# Patient Record
Sex: Female | Born: 1955 | Race: White | Hispanic: No | Marital: Married | Smoking: Never smoker
Health system: Southern US, Community
[De-identification: ages and names within clinical notes are randomized; demographics above are authoritative.]

## PROBLEM LIST (undated history)

## (undated) DIAGNOSIS — F319 Bipolar disorder, unspecified: Secondary | ICD-10-CM

## (undated) DIAGNOSIS — F329 Major depressive disorder, single episode, unspecified: Secondary | ICD-10-CM

## (undated) DIAGNOSIS — F32A Depression, unspecified: Secondary | ICD-10-CM

## (undated) DIAGNOSIS — K5792 Diverticulitis of intestine, part unspecified, without perforation or abscess without bleeding: Secondary | ICD-10-CM

## (undated) HISTORY — DX: Diverticulitis of intestine, part unspecified, without perforation or abscess without bleeding: K57.92

## (undated) HISTORY — DX: Major depressive disorder, single episode, unspecified: F32.9

## (undated) HISTORY — DX: Bipolar disorder, unspecified: F31.9

## (undated) HISTORY — PX: SINOSCOPY: SHX187

## (undated) HISTORY — DX: Depression, unspecified: F32.A

---

## 1988-08-31 HISTORY — PX: APPENDECTOMY: SHX54

## 1989-08-31 HISTORY — PX: SINUS ENDO W/FUSION: SHX777

## 2007-09-01 HISTORY — PX: CHOLECYSTECTOMY: SHX55

## 2014-09-19 ENCOUNTER — Ambulatory Visit (INDEPENDENT_AMBULATORY_CARE_PROVIDER_SITE_OTHER): Payer: BLUE CROSS/BLUE SHIELD | Admitting: Neurology

## 2014-09-19 ENCOUNTER — Encounter: Payer: Self-pay | Admitting: Neurology

## 2014-09-19 VITALS — BP 112/75 | HR 89 | Ht 63.0 in | Wt 166.0 lb

## 2014-09-19 DIAGNOSIS — R413 Other amnesia: Secondary | ICD-10-CM

## 2014-09-19 NOTE — Progress Notes (Signed)
Reason for visit: Memory disturbance   Bridget Long is a 59 y.o. female  History of present illness:  Bridget Long is a 59 year old right-handed white female with a history of some issues with memory that she indicates has been significant since March 2015. The patient reports difficulty with focusing, and staying on task. She is easily distracted, and cannot complete tasks. The patient goes on to say that she has had problems with procrastinating in getting things done, she has not paid her taxes in over 4 years. She does report some troubles with memory, she cannot remember her password for the computer, she has difficulty remembering her own address and her phone number. The patient may have to go and look at her driver's license to remember her living address. She denies any balance issues or problems with numbness or weakness on the arms, legs, or face. She denies any problems controlling the bowels or the bladder. She indicates that her sister is hyperactive, and may have ADD. The patient has had issues with depression and anxiety. She has been seeing Dr. Evelene Croon, and she recently was placed on Adderall with some good benefit. She is also on an antidepressant. Her mother died about 2 weeks ago, and she is getting over this loss. She is currently taking a leave of absence from work, as she has not been functioning well at work. Her productivity level has dropped off significantly. She is sent to this office for further evaluation.  Past Medical History  Diagnosis Date  . Depression   . Diverticulitis     Past Surgical History  Procedure Laterality Date  . Appendectomy  1990  . Cholecystectomy  2009  . Sinus endo w/fusion  1991    Family History  Problem Relation Age of Onset  . Pulmonary embolism Father   . Alzheimer's disease Father   . Healthy Sister     Social history:  reports that she has never smoked. She has never used smokeless tobacco. She reports that she drinks  alcohol. She reports that she does not use illicit drugs.  Medications:  No current outpatient prescriptions on file prior to visit.   No current facility-administered medications on file prior to visit.      Allergies  Allergen Reactions  . Sulfa Antibiotics Rash    ROS:  Out of a complete 14 system review of symptoms, the patient complains only of the following symptoms, and all other reviewed systems are negative.  Memory loss Depression, too much sleep, decreased energy, disinterest in activities, anxiety  Blood pressure 112/75, pulse 89, height  (1.6 m), weight 166 lb (75.297 kg).  Physical Exam  General: The patient is alert and cooperative at the time of the examination.  Eyes: Pupils are equal, round, and reactive to light. Discs are flat bilaterally.  Neck: The neck is supple, no carotid bruits are noted.  Respiratory: The respiratory examination is clear.  Cardiovascular: The cardiovascular examination reveals a regular rate and rhythm, no obvious murmurs or rubs are noted.  Skin: Extremities are without significant edema.  Neurologic Exam  Mental status: The patient is alert and oriented x 3 at the time of the examination. The patient has apparent normal recent and remote memory, with an apparently normal attention span and concentration ability. MOCA test score is 27/30. The patient named 19 animals on the animal fluency test.  Cranial nerves: Facial symmetry is present. There is good sensation of the face to pinprick and soft touch bilaterally.  The strength of the facial muscles and the muscles to head turning and shoulder shrug are normal bilaterally. Speech is well enunciated, no aphasia or dysarthria is noted. Extraocular movements are full. Visual fields are full. The tongue is midline, and the patient has symmetric elevation of the soft palate. No obvious hearing deficits are noted.  Motor: The motor testing reveals 5 over 5 strength of all 4  extremities. Good symmetric motor tone is noted throughout.  Sensory: Sensory testing is intact to pinprick, soft touch, vibration sensation, and position sense on all 4 extremities. No evidence of extinction is noted.  Coordination: Cerebellar testing reveals good finger-nose-finger and heel-to-shin bilaterally.  Gait and station: Gait is normal. Tandem gait is normal. Romberg is negative. No drift is seen.  Reflexes: Deep tendon reflexes are symmetric and normal bilaterally. Toes are downgoing bilaterally.   Assessment/Plan:  1. Reported memory disturbance  2. 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 Depression, anxiety  The patient likely has a problem with a pre-existing attention deficit disorder associated with underlying depression and anxiety. The patient does have a history of dementia in the family, her father had Alzheimer's disease. The patient will be sent for blood work today, and she will have MRI evaluation of the brain. She will follow-up through this office if needed, we can see her back if she feels that her cognitive levels are deteriorating. Formal neuropsychological testing in the future may be indicated. She indicates that she has gained benefit with the Adderall, and she is functioning better cognitively.  Marlan Palau. Keith Willis MD 09/19/2014 7:01 PM  [facility] [address] [place], [place] [id number]  Phone [phone] Fax [phone]

## 2014-09-19 NOTE — Patient Instructions (Signed)

## 2014-09-20 LAB — SPECIMEN STATUS REPORT

## 2014-09-21 LAB — COPPER, SERUM: Copper: 132 ug/dL (ref 72–166)

## 2014-09-21 LAB — TSH: TSH: 2.37 u[IU]/mL (ref 0.450–4.500)

## 2014-09-21 LAB — SEDIMENTATION RATE: Sed Rate: 3 mm/hr (ref 0–40)

## 2014-09-21 LAB — HIV ANTIBODY (ROUTINE TESTING W REFLEX): HIV 1/HIV 2 AB: NONREACTIVE

## 2014-09-21 LAB — VITAMIN B12: Vitamin B-12: 1234 pg/mL — ABNORMAL HIGH (ref 211–946)

## 2014-09-21 LAB — RPR QUALITATIVE: RPR: NONREACTIVE

## 2014-09-21 LAB — SPECIMEN STATUS REPORT

## 2014-09-26 ENCOUNTER — Ambulatory Visit
Admission: RE | Admit: 2014-09-26 | Discharge: 2014-09-26 | Disposition: A | Discharge status: Home / Self Care | Payer: BLUE CROSS/BLUE SHIELD | Source: Ambulatory Visit | Attending: Neurology | Admitting: Neurology

## 2014-09-26 DIAGNOSIS — R413 Other amnesia: Secondary | ICD-10-CM

## 2014-09-27 ENCOUNTER — Telehealth: Payer: Self-pay | Admitting: Neurology

## 2014-09-27 DIAGNOSIS — R9089 Other abnormal findings on diagnostic imaging of central nervous system: Secondary | ICD-10-CM

## 2014-09-27 NOTE — Telephone Encounter (Signed)
I called the patient. The MRI the brain shows a number of small white matter changes in both hemispheres. These are nonspecific, but given her complaints of a progressive cognitive change, I have recommended considering lumbar puncture to exclude multiple sclerosis. The patient will "think about it" and call me back if she wishes to do the procedure.   MRI brain 09/27/2014:  IMPRESSION:  Abnormal MRI brain (without) demonstrating: 1. Multiple periventricular, subcortical and juxtacortical foci of T2 hyperintensities. These findings are non-specific and considerations include autoimmune, inflammatory, post-infectious, microvascular ischemic or migraine associated etiologies. 2. No acute findings.

## 2014-09-28 NOTE — Addendum Note (Signed)
Addended by: Stephanie AcreWILLIS, Anyeli Hockenbury on: 09/28/2014 05:41 PM   Modules accepted: Orders

## 2014-09-28 NOTE — Telephone Encounter (Signed)
I called the patient, left a message. We will set up the lumbar puncture.

## 2014-09-28 NOTE — Telephone Encounter (Signed)
Patient stated she would like to proceed with Lumbar Puncture to exclude multiple sclerosis.  Please call and advise.

## 2014-10-08 ENCOUNTER — Telehealth: Payer: Self-pay | Admitting: Neurology

## 2014-10-08 NOTE — Telephone Encounter (Signed)
I will get the lumbar puncture on schedule.

## 2014-10-08 NOTE — Telephone Encounter (Signed)
Patient is calling to get scheduled for a lumbar spinal puncture. I do not see a referral on file. Please call her.

## 2014-10-08 NOTE — Telephone Encounter (Signed)
Patient would like to proceed with LP, don't see any referral/order for the procedure.

## 2014-10-09 NOTE — Telephone Encounter (Signed)
Spoke to patient and she is scheduled for 10-1514 at 12 noon for her LP.

## 2014-10-15 ENCOUNTER — Encounter: Payer: Self-pay | Admitting: Neurology

## 2014-10-16 ENCOUNTER — Telehealth: Payer: Self-pay | Admitting: Neurology

## 2014-10-16 NOTE — Telephone Encounter (Signed)
Noted  

## 2014-10-16 NOTE — Telephone Encounter (Signed)
Left message for patient to call back to reschedule lumbar puncture.  I relayed that Friday at 0930 would work for the doctor but did not schedule her until I hear back.

## 2014-10-16 NOTE — Telephone Encounter (Signed)
Patient was given Friday 9:30 apt for lumbar puncture. GB

## 2014-10-16 NOTE — Telephone Encounter (Signed)
Patient calling Bridget Long back to say that Friday at 9:30 works for her if it is still available, if not she will take the next available.  She was at another appointment and could not pick up earlier. Please call at [phone].

## 2014-10-19 ENCOUNTER — Ambulatory Visit (INDEPENDENT_AMBULATORY_CARE_PROVIDER_SITE_OTHER): Payer: BLUE CROSS/BLUE SHIELD | Admitting: Neurology

## 2014-10-19 ENCOUNTER — Other Ambulatory Visit: Payer: Self-pay | Admitting: Neurology

## 2014-10-19 ENCOUNTER — Encounter: Payer: Self-pay | Admitting: Neurology

## 2014-10-19 VITALS — BP 121/75 | HR 97

## 2014-10-19 DIAGNOSIS — R9089 Other abnormal findings on diagnostic imaging of central nervous system: Secondary | ICD-10-CM | POA: Insufficient documentation

## 2014-10-19 DIAGNOSIS — R413 Other amnesia: Secondary | ICD-10-CM

## 2014-10-19 DIAGNOSIS — R930 Abnormal findings on diagnostic imaging of skull and head, not elsewhere classified: Secondary | ICD-10-CM

## 2014-10-19 NOTE — Addendum Note (Signed)
Addended by: Margo AyeLOGAN, Tanairi Cypert L on: 10/19/2014 03:02 PM   Modules accepted: Orders

## 2014-10-19 NOTE — Addendum Note (Signed)
Addended by: Stephanie AcreWILLIS, Kendell Gammon on: 10/19/2014 11:45 AM   Modules accepted: Orders

## 2014-10-19 NOTE — Addendum Note (Signed)
Addended by: Margo AyeLOGAN, Josemanuel Eakins L on: 10/19/2014 10:38 AM   Modules accepted: Orders

## 2014-10-19 NOTE — Procedures (Signed)
    Lumbar puncture procedure note  History:  Bridget Long is a 59 year old patient with a history of progressive memory issues, MRI the brain has shown white matter changes. The patient being evaluated for possible demyelinating disease.  The patient was placed in the fetal position on the right side, and the low back was cleaned with Betadine solution. Approximately 2 cc of 1% Xylocaine was used as a local anesthetic. A 20-gauge spinal needle was inserted into the L3-4 interspace, and approximately 18 cc of clear colorless spinal fluid was removed for testing. Opening pressure was 190 mm of water.  Tube #1 was sent for VDRL, cryptococcal antigen, angiotensin-converting enzyme level.  Tube #2 was sent for oligoclonal banding.  Tube #3 was sent for cells, differential, glucose, and protein.  Tube #4 was sent for Lyme antibody panel.  The patient tolerated the procedure well. No complications of the above procedure were noted.   Blood work was sent for rheumatoid factor, ANA, ANCA, Lyme antibody panel, angiotensin-converting enzyme level.

## 2014-10-20 LAB — CELL COUNT, CSF
Nuc cell # CSF: 2 cells/uL (ref 0–5)
RBC COUNT CSF: 0 /uL

## 2014-10-22 LAB — IGG CSF INDEX
ALBUMIN CSF-MCNC: 12 mg/dL (ref 11–48)
Albumin: 4.3 g/dL (ref 3.5–5.5)
IgG (Immunoglobin G), Serum: 551 mg/dL — ABNORMAL LOW (ref 700–1600)
IgG Index, CSF: 0.5 (ref 0.0–0.7)
IgG, CSF: 0.7 mg/dL (ref 0.0–8.6)
IgG/Albumin Ratio, CSF: 0.06 (ref 0.00–0.25)

## 2014-10-22 LAB — OLIGOCLONAL BANDING

## 2014-10-23 LAB — OLIGOCLONAL BANDING

## 2014-10-23 LAB — SPECIMEN STATUS REPORT

## 2014-10-25 ENCOUNTER — Telehealth: Payer: Self-pay | Admitting: *Deleted

## 2014-10-25 NOTE — Telephone Encounter (Signed)
Patient calling wanting the results of her LP. She has 2 other doctor appt today and wants to know if there is anything she needs to pass along to her other physicians. Please advise.

## 2014-10-25 NOTE — Telephone Encounter (Signed)
I called patient. The blood work and spinal fluid results are unremarkable, only the angiotensin-converting enzyme level and the spinal fluid is pending. There are 0 oligoclonal bands, no evidence of demyelinating disease such as multiple sclerosis.

## 2014-10-26 LAB — LYME, WESTERN BLOT, CSF
LYME IGM WB INTERP.: NEGATIVE
Lyme IgG WB Interp.: NEGATIVE
P18 AB.: ABSENT
P23 AB.: ABSENT
P23 Ab. IgM: ABSENT
P28 Ab.: ABSENT
P30 AB.: ABSENT
P39 Ab. IgM: ABSENT
P39 Ab.: ABSENT
P41 Ab. IgM: ABSENT
P41 Ab.: ABSENT
P45 Ab.: ABSENT
P58 Ab.: ABSENT
P66 Ab.: ABSENT
P93 Ab.: ABSENT

## 2014-10-26 LAB — PAN-ANCA
Atypical pANCA: <1:20 {titer}
C-ANCA: <1:20 {titer}
Myeloperoxidase Ab: <9 U/mL (ref 0.0–9.0)

## 2014-10-26 LAB — CRYPTOCOCCUS ANTIGEN, CSF: Cryptococcus Antigen, CSF: NEGATIVE

## 2014-10-26 LAB — RHEUMATOID FACTOR: RHEUMATOID FACTOR: 8.7 [IU]/mL (ref 0.0–13.9)

## 2014-10-26 LAB — B. BURGDORFI ANTIBODIES

## 2014-10-26 LAB — ANGIOTENSIN CONVERTING ENZYME, CSF: Angio Convert Enzyme, CSF: 1.3 U/L (ref 0.0–2.5)

## 2014-10-26 LAB — GLUCOSE, CSF: Glucose, CSF: 68 mg/dL (ref 40–70)

## 2014-10-26 LAB — ANGIOTENSIN CONVERTING ENZYME: ANGIO CONVERT ENZYME: 69 U/L (ref 14–82)

## 2014-10-26 LAB — PROTEIN, CSF: Protein, CSF: 18.9 mg/dL (ref 0.0–44.0)

## 2014-10-26 LAB — ANA W/REFLEX: Anti Nuclear Antibody(ANA): NEGATIVE

## 2014-10-26 LAB — VDRL, CSF: SYPHILIS VDRL QUANT CSF: NONREACTIVE

## 2015-01-31 ENCOUNTER — Ambulatory Visit: Payer: BLUE CROSS/BLUE SHIELD | Attending: Psychology | Admitting: Psychology

## 2015-01-31 ENCOUNTER — Encounter: Payer: Self-pay | Admitting: Psychology

## 2015-01-31 DIAGNOSIS — R419 Unspecified symptoms and signs involving cognitive functions and awareness: Secondary | ICD-10-CM

## 2015-01-31 NOTE — Progress Notes (Addendum)
[facility]  [address]   Telephone [phone] [address] Fax [phone] [place], [place] [address]  Initial Contact Note  Name:  Bridget Long Rhome Date of Birth; [birth year] MRN:  [id number] Date:  01/31/2015  Bridget Long Sailer is an 59 y.o. female with a history of Major Depression and recent abnormal brain MRI scan who was referred for neuropsychological evaluation by Milagros Evenerupinder Kaur, MD due to problems with attention, concentration and memory.   A total of 5 hours was spent today reviewing medical records, interviewing (CPT [id number]) Bridget Long Richoux and administering and scoring neurocognitive tests (CPT [id number] & [phone]).  Preliminary Diagnostic Impression: Cognitive Complaints [R41.9]  There were no concerns expressed or behaviors displayed by Bridget Long Wiegman that would require immediate attention.   A full report will follow once the planned testing has been completed. Her next appointment is scheduled for 02/06/15.   Gladstone PihMichael F. Babara Buffalo, Ph.D Licensed Psychologist 01/31/2015

## 2015-02-06 ENCOUNTER — Ambulatory Visit (INDEPENDENT_AMBULATORY_CARE_PROVIDER_SITE_OTHER): Payer: BLUE CROSS/BLUE SHIELD | Admitting: Psychology

## 2015-02-06 DIAGNOSIS — R419 Unspecified symptoms and signs involving cognitive functions and awareness: Secondary | ICD-10-CM

## 2015-02-06 NOTE — Progress Notes (Addendum)
[facility]  [address]   Telephone [phone] [address] Fax [phone] [place], [place] [address]   NEUROPSYCHOLOGICAL EVALUATION  *CONFIDENTIAL* This report should not be released without the consent of the client  Name:   Bridget Long Date of Birth:  [birth year] [facility] MR#:  [id number] Dates of Evaluation: 01/31/15 & 02/06/15  Reason for Referral Bridget Long is a 59 year old right-handed woman who was referred for neuropsychological evaluation by her psychiatrist, Milagros Evener MD. Dr. Evelene Croon has been treating her for Major Depressive Disorder. Bridget Long demonstrated an abrupt onset of problems with sustained attention, concentration and memory in 2012 that have since worsened. Use of amphetamine-dextroamphetamine since November 2015 has been a bit helpful. She was evaluated by Dr. Anne Hahn of [facility] on 09/19/14. He initially concluded that she likely has "a pre-existing attention deficit disorder associated with underlying depression and anxiety". An MRI the brain on 09/26/14 showed multiple, non-specific small white matter changes in both hemispheres. A lumbar puncture on 10/19/14, which was ordered to exclude a demyelinating disease, was unremarkable. Dr. Evelene Croon prescribed Viibryd in March 2015 and most recently Lithium in May 2016.   Sources of Information Electronic medical records from the [facility] were reviewed. Bridget Long was interviewed.    Chief Complaints Bridget Long reported that for the first time in her life she began having difficulties with focus, mental speed, persisting to task and memory in 2012 coincident to moving to this area from [place] to start a new job. 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 She recollected often getting bogged down in details and finding ways to avoid doing assigned tasks.  She reported that on average more than once per day she forgot where she had put something, had problems finding words while speaking and lost track of what she had just said. She reported a sharp decline in her organizational skills. On occasion she has had difficulty remembering her own address and her phone number. She noted that she also experienced similar cognitive difficulties outside of work. She stopped completing most household chores because "I didn't want to". She reported that she was in "a deep funk" at that time.  She reported that in spring 2015 her cognitive difficulties worsened. 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 She came to realize how much she had been depending on this co-worker to help her perform her job. She took a medical leave of absence from work in November 2015 after her productivity level had significantly declined. She reported that use of amphetamine-dextroamphetamine beginning in November 2015 improved her focus and motivation though not close to her usual baseline. She returned to work in April 2015 but took another leave from work after six weeks to deal with the estate of her mother, who had died in 2013/09/04.   She reported having felt depressed most of the time over the past two years. She did not report a history of mania though did describe herself as fidgety, easily irritated, feeling overly active or compelled to do things, talking too much and finishing the sentences of others. She reported two instances of engaging in excessive spending within the past year.  She stated that her cognitive functioning and mood have notably improved just within the past week  or so subsequent to starting Lithium in late May 2016.    She denied problems with balance, gait, eye-hand coordination, limb strength, vision, hearing, sleep, daytime alertness, speech, taste or  swallowing. She did not report being in pain.  Background Bridget Long lives with her husband of ten years. She has two adult sons from her first marriage, which ended in divorce after fifteen years.   She reported no history of developmental delays, unusual childhood illnesses or injuries, inattentiveness, learning difficulties or behavioral problems.   After graduating high school with good grades, she matriculated at [employer]. Due to financial limitations, she was forced to drop out in her second year. She then joined the [employer]. She spent almost fourteen years in the [employer], eventually attaining the rank of captain. She was stationed in the [place] and worked in Public affairs consultant. While in the [employer], she earned a Heritage manager in Actuary.     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 In recent years she has worked for [employer] as a Emergency planning/management officer. Since 2014 she has been employed as a Therapist, nutritional" for a bank with primary responsibility of seeking ways to improve the efficiency of banking systems.  Her past medical history was notable only for diverticulitis. She reported no history of head injury, loss of consciousness, stroke-like symptoms, seizures or exposure to toxic chemicals.  She reported rare use of alcohol over the past many years. She acknowledged a period of excessive drinking while in college. She denied use of illicit drugs or tobacco products.   With regards to family medical history, she reported that her father had depression followed by signs of memory loss in his sixties that was eventually diagnosed as Alzheimer's disease. She stated that her mother was diagnosed with Bipolar Disorder. She has suspected that her sister has an Attention Deficit Disorder.  She reported no history of emotional difficulties until 2012. She reported  that her primary care physician prescribed her Zoloft in 2012 for what was termed seasonal depression. She has been seeing a Research scientist (physical sciences) off and on since 2013. Her first psychiatric contact was with Dr. Evelene Croon in 2015. She reported no history of suicidal behavior, panic anxiety, obsessive-compulsive behavior or psychotic symptoms.   Her current medications include amphetamine-dextroamphetamine, fexofenadine, lamotrigine, Lithium and Viibryd.  She reported no past of current legal issues.  Evaluation Procedures Animal Naming Test  Beck Anxiety Inventory Beck Depression Inventory-II Boston Naming Test Conners Continuous Performance Test-II Controlled Oral Word Association Test Everyday Memory Questionnaire Rey Complex Figure: Copy Trail Making A & B Wechsler Adult Intelligence Scale-IV:  Clinical cytogeneticist, Coding, Digit Span, Matrix Reasoning & Similarities Wechsler Memory Scale-IV Wide Range Achievement Test-4: Word Reading [employer] Test  Test Results Test Validity & Interpretive Considerations It was concluded that the test results represented a valid measure of her cognitive functioning. She did not report or display problems with vision (she wore her eyeglasses), hearing or motor skills. She did not display signs of inattention, psychomotor slowing or emotional distress. She was able to comprehend task instructions without difficulty. There were no signs of careless or perseverative responding. She appeared to expend maximal effort.   She stated that she did not take amphetamine-dextroamphetamine on the days of testing.    Her intellectual potential was estimated to fall at least within the Average range based  on demographic factors coupled with a measure of word reading (Wide Range Achievement Test-4) that represents an over-learned verbal skill relatively resistant to the effects of neurological disorder or injury.   Her test scores were corrected to  reflect norms for her age and whenever possible, her gender and educational level (i.e., 16 years). A listing of test results can be found at the end of this report.  Speed of Processing & Attention Her performance on a test that required sustained visual attention to detect targets flashed on a screen (Continuous Performance Test II) did not indicate attentional problems. She was able to detect a typical number of targets. Her reaction times were faster than average and highly consistent. She did make a high number of responses to non-targets, which indicated difficulty inhibiting responses suggestive of an impulsive style.  Her speed to complete brief visual attentional tests that required transcription of symbols to match digits using a key (Wechsler Adult Intelligence Scale-IV (WAIS-IV) Coding) or drawing lines to connect randomly arrayed numbers in sequence (Trails A) were within the Average range.   Her attentional capacity to encode, hold and manipulate information in temporary memory was within normal expectations. Her scores varied from the Low Average to the Superior range on tests that required mental rearranging of digits in reverse or ascending order (WAIS-IV Digit Span), immediate recognition of symbols in left to right order (Wechsler Memory Scale-IV (WMS-IV) Symbol Span) or immediate recall of spatial locations within a grid (WMS-IV Spatial Addition).  Learning & Memory On the WMS-IV, a composite measure of her ability to learn and retain orally-presented information (Auditory Memory Index) fell solidly within the Average range. She demonstrated a relative strength on a test of learning word pairs to repetition. Her ability to learn and retain visual information (Visual Memory Index) was relatively less well developed at the lower boundary of the Average range. More specifically, her visual memory was weakest at the stage of immediate recall as her immediate visual memory subtest scores  varied from the 9th to 25th percentiles. A composite measure of her delayed memory (Delayed Memory Index), assessed after an approximate thirty minute interval, fell within the Average range compared to same aged peers. Not only did she demonstrate normal retention of both auditory and visual information, but her delayed recall, especially for visual information, actually exceeded expectations given her level of immediate memory. This pattern suggested that she benefitted from the extra time of the delay interval to more fully consolidate information into memory.   Executive Personnel officer functions comprise higher level attentional, organizational and reasoning processes that enable a person to successfully initiate, monitor, shift, plan and execute complex behavior.   Her speed to maintain an ascending and alternating sequence of numbers and letters (Trails B), which required mental tracking and set shifting, fell within the Low Average range. She maintained the sequence without error.    While she demonstrated difficulty with response inhibition on a target detection task, there were no other signs of impulsive tendencies.   Verbal fluency tests require cognitive flexibility as manifested by efficiently retrieving information from long-term semantic memory, tracking prior responses and blocking intrusions from other categories. Her ability to name members of a category (Animal Naming Test) fell within the Low Average range while her ability to generate words to designated letters (Controlled Oral Word Association Test) fell within the Average range.  Conceptual reasoning and problem-solving were normal. Her performances on tests of abstract verbal reasoning (WAIS-IV Similarities) and nonverbal reasoning (WAIS-IV Matrix  Reasoning) fell within the Average range. Her performance on a test of nonverbal problem-solving that required using ongoing feedback to infer logical ways to sort geometric designs  [employer]([place] Card Sorting Test) was within normal expectations. She quickly inferred the initial sorting principle, deduced all possible sorting ideas, for the most part maintained conceptual strategy in working memory and adaptively shifted to a new strategy given feedback that the previously correct strategy had become incorrect.   Language From a qualitative perspective, no problems were evident for expressive or receptive communication. Her scores on tests of phonemic fluency (Controlled Oral Word Association Test) and naming to confrontation [facility](Boston Naming Test) fell within the Average range. Her ability to read words (Wide Range Achievement Test-4: Word Reading) fell within the High Average range.   Visual-Spatial Perception & Organization There were no signs of spatial inattention or problems with visual recognition. Her performance on a task that assessed her ability to analyze and organize within the visuospatial domain by assembling two-dimensional block designs from models (WAIS-IV Block Design) fell within the Average range. Her drawing of a spatially complex geometric figure (Rey Complex Figure) was within normal limits.  Emotional Status She reported relatively high levels of emotional distress on standardized symptom inventories. On the Beck Depression Inventory-II, her score of 29 fell just within the severely depressed range. Her most prominent symptoms (i.e., 3 on a 0 - 3 scale) were loss of pleasure, trouble making decisions and loss of interest in sex. She did not report having had suicidal thoughts within the past week. On the [facility] Anxiety Inventory, her score of 17 fell within the moderate range. Her most troublesome symptoms were feeling "terrified or afraid" and "hot/cold sweats".    Summary & Conclusions Jolly MangoMachelle Long is a 59 year old woman being treated for Major Depression who demonstrated a sudden onset of problems with sustained attention, concentration and memory in 2012  that have since worsened.   Overall, her neuropsychological profile was predominantly within normal expectations. There were no indications of an attentional disorder or decrement for sustained concentration. Assessment of memory indicated that measures of auditory and visual memory were within the Average range though she demonstrated a relative weakness for immediate visual memory. Not only did she demonstrate normal retention of both auditory and visual information, but she benefitted from the extra time of the delay interval to more fully consolidate information, especially visual, into memory. Some of her performances on tests of executive functioning were mildly below expectations as she had difficulty inhibiting responses to non-targets on a vigilance task, performed variably on tests of working memory span and scored within the Low Average range on measures of category fluency and set shifting efficiency. There were no indications of problems for language or visual-spatial organizational skills.  With regards to her emotional status, she reported feeling more positive and less irritable within the past two weeks, which was shortly after she began taking Lithium in late May 2016. She still reported clinically significant symptoms of depression and anxiety on standardized symptom inventories. She reported that over the past two years she has felt depressed most of the time. She also described herself as fidgety, easily irritated, very talkative at times and overly active.  In conclusion, her cognitive difficulties are likely secondary to a mood disorder. It is possible that she has Bipolar disorder in light of her report of irritability, physical restlessness and distractibility; an absence of attentional or executive difficulties as a child or young adult; a family history of Bipolar Disorder  and recent improvement in mood and cognitive functioning subsequent to use of Lithium.  The results from this  evaluation were discussed with Bridget Long on 02/06/15. We had a lengthy discussion about strategies to minimize her distractibility and improve her everyday organization. The benefits of adequate sleep and regular exercise for brain health and mood were discussed.   I have appreciated the opportunity to evaluate Bridget Long. Please feel free to contact me with any comments or questions.    ______________________ Gladstone Pih, Ph.D Licensed Psychologist   ADDENDUM-NEUROPSYCHOLOGICAL TEST RESULTS  Animal Naming Test Score= 18 18th  (adjusted for age, gender and educational level)    Boston Naming Test Score= 58/60 73rd (adjusted for age, gender and educational level)       Conners' Continuous Performance Test II (selected measures) Measure Guideline  Omissions Within average range  Commissions Mildly atypical  Hit Reaction Time A little fast  Perseverations Within average range  Hit Reaction Time Block Change Within average range  Hit Standard Error Block Change Within average range    Controlled Oral Word Association Test Score=  39 words/5 repetitions 31st (adjusted for age, gender and educational level)    Rey Complex Figure: copy       Score= 34.5/36  Normal       Trails A Score= 23s 0e 70th   (adjusted for age, gender and educational level)  Trails B Score= 76s 0e 21st    (adjusted for age, gender and educational level)    Wechsler Adult Intelligence Scale-IV  Subtest Scaled Score Percentile  Block Design 11 63rd    Similarities 12 75th   Digit Span  Forward               Backward               Sequencing 13 11 14 11  84th        63rd    91st    63rd   Matrix Reasoning 11 63rd   Coding   11 63rd     Wechsler Memory Scale-IV   Index Index Score Percentile  Immediate Memory   89 23rd   Auditory Memory 101 53rd    Visual Memory   90 25th    Delayed Memory 102 55th    Visual Working Memory    68 27th      [employer]  Total  errors= 19 42nd (adjusted for age and education)  Perseverative errors= 12 34th   Categories= 6 >16th   Trials to first category= 11 >16th   Failure to maintain set 2 >16th   Learning to learn= 0.39 >16th     Wide Range Achievement Test-4 Subtest  Raw score Standard score Percentile  Word Reading 67/70 116 86th

## 2015-02-08 ENCOUNTER — Telehealth: Payer: Self-pay | Admitting: Neurology

## 2015-02-08 NOTE — Telephone Encounter (Signed)
I called patient. The formal neuropsychological evaluation testing did not suggest a true dementing illness. This suggested that overlying depression is the major etiology of the cognitive slowing.    Neuropsychological testing results 02/08/2015:  Summary & Conclusions Bridget Long is a 59 year old woman being treated for Major Depression who demonstrated a sudden onset of problems with sustained attention, concentration and memory in 2012 that have since worsened.   Overall, her neuropsychological profile was predominantly within normal expectations. There were no indications of an attentional disorder or decrement for sustained concentration. Assessment of memory indicated that measures of auditory and visual memory were within the Average range though she demonstrated a relative weakness for immediate visual memory. Not only did she demonstrate normal retention of both auditory and visual information, but she benefitted from the extra time of the delay interval to more fully consolidate information, especially visual, into memory. Some of her performances on tests of executive functioning were mildly below expectations as she had difficulty inhibiting responses to non-targets on a vigilance task, performed variably on tests of working memory span and scored within the Low Average range on measures of category fluency and set shifting efficiency. There were no indications of problems for language or visual-spatial organizational skills.  With regards to her emotional status, she reported feeling more positive and less irritable within the past two weeks, which was shortly after she began taking Lithium in late May 2016. She still reported clinically significant symptoms of depression and anxiety on standardized symptom inventories. She reported that over the past two years she has felt depressed most of the time. She also described herself as fidgety, easily irritated, very talkative at times and  overly active.  In conclusion, her cognitive difficulties are likely secondary to a mood disorder. It is possible that she has Bipolar disorder in light of her report of irritability, physical restlessness and distractibility; an absence of attentional or executive difficulties as a child or young adult; a family history of Bipolar Disorder and recent improvement in mood and cognitive functioning subsequent to use of Lithium.

## 2015-02-12 ENCOUNTER — Telehealth: Payer: Self-pay | Admitting: Neurology

## 2015-02-12 NOTE — Telephone Encounter (Signed)
The patient not take has 2 different problems. The patient has abnormal MRI findings on the brain, and some cognitive issues. Both of these will be followed over time. The neuropsychological evaluation suggested there may be a bipolar disorder at work. I would recommend keeping the appointment, but if the patient wishes cancel, she may do so.

## 2015-02-12 NOTE — Telephone Encounter (Signed)
Patient called and requested to talk to Dr. Anne Hahn regarding her 6/23 appt. She would like to know if she still needs to come to this appt. Please call and advise.

## 2015-02-21 ENCOUNTER — Encounter: Payer: Self-pay | Admitting: Nurse Practitioner

## 2015-02-21 ENCOUNTER — Ambulatory Visit (INDEPENDENT_AMBULATORY_CARE_PROVIDER_SITE_OTHER): Payer: BLUE CROSS/BLUE SHIELD | Admitting: Nurse Practitioner

## 2015-02-21 VITALS — BP 101/70 | HR 90 | Ht 63.0 in | Wt 161.0 lb

## 2015-02-21 DIAGNOSIS — R413 Other amnesia: Secondary | ICD-10-CM | POA: Diagnosis not present

## 2015-02-21 DIAGNOSIS — R9089 Other abnormal findings on diagnostic imaging of central nervous system: Secondary | ICD-10-CM

## 2015-02-21 DIAGNOSIS — R930 Abnormal findings on diagnostic imaging of skull and head, not elsewhere classified: Secondary | ICD-10-CM

## 2015-02-21 NOTE — Progress Notes (Signed)
[facility]  PATIENT: Bridget Long DOB: April 18, 1956   REASON FOR VISIT: Follow-up for memory disturbance, abnormal MRI of the brain HISTORY FROM: Patient    HISTORY OF PRESENT ILLNESS:Bridget Long is a 59 year old right-handed white female with a history of some issues with memory that she indicates has been significant since March 2015. The patient reports difficulty with focusing, and staying on task. She is easily distracted, and cannot complete tasks. The patient goes on to say that she has had problems with procrastinating in getting things done, she has not paid her taxes in over 4 years. She does report some troubles with memory, she cannot remember her password for the computer, she has difficulty remembering her own address and her phone number. The patient may have to go and look at her driver's license to remember her living address. She denies any balance issues or problems with numbness or weakness on the arms, legs, or face. She denies any problems controlling the bowels or the bladder. She indicates that her sister is hyperactive, and may have ADD. The patient has had issues with depression and anxiety. She has been seeing Dr. Evelene Croon, and she recently was placed on Adderall with some good benefit. She is also on an antidepressant. Her mother died about 2 weeks ago, and she is getting over this loss. She is currently taking a leave of absence from work, as she has not been functioning well at work. Her productivity level has dropped off significantly. She is sent to this office for further evaluation. UPDATE 02/21/15 Bridget Long  59 year old female returns for follow-up. She was last seen in this office in initially evaluated for memory disturbance by Dr. Anne Hahn 09/19/2014. MRI of the brain was abnormal showing nonspecific white matter changes in both hemispheres. She was set up for an LP to rule out multiple sclerosis,0 oligoclonal bands were found, therefore no evidence  of demyelinating disease. In addition she had neuropsychological testing with the conclusion of she had overlying depression as the major etiology for her cognitive slowing. She has since been placed on lithium by her psychiatrist and she feels much better she returned to work. She also sees a Veterinary surgeon. She returns for reevaluation   REVIEW OF SYSTEMS: Full 14 system review of systems performed and notable only for those listed, all others are neg:  Constitutional: neg  Cardiovascular: neg Ear/Nose/Throat: neg  Skin: neg Eyes: neg Respiratory: neg Gastroitestinal: neg  Hematology/Lymphatic: neg  Endocrine: neg Musculoskeletal:neg Allergy/Immunology: neg Neurological: neg Psychiatric: Depression Sleep : neg   ALLERGIES: Allergies  Allergen Reactions  . Sulfa Antibiotics Rash    HOME MEDICATIONS: Outpatient Prescriptions Prior to Visit  Medication Sig Dispense Refill  . amphetamine-dextroamphetamine (ADDERALL) 20 MG tablet Take 20 mg by mouth daily.  0  . fexofenadine (ALLEGRA) 180 MG tablet Take 180 mg by mouth daily.    Marland Kitchen lamoTRIgine (LAMICTAL) 150 MG tablet Take 150 mg by mouth daily.     No facility-administered medications prior to visit.    PAST MEDICAL HISTORY: Past Medical History  Diagnosis Date  . Depression   . Diverticulitis     PAST SURGICAL HISTORY: Past Surgical History  Procedure Laterality Date  . Appendectomy  1990  . Cholecystectomy  2009  . Sinus endo w/fusion  1991    FAMILY HISTORY: Family History  Problem Relation Age of Onset  . Pulmonary embolism Father   . Alzheimer's disease Father   . Healthy Sister     SOCIAL HISTORY: History  Social History  . Marital Status: Married    Spouse Name: N/A  . Number of Children: 2  . Years of Education: BS   Occupational History  . [employer]    Social History Main Topics  . Smoking status: Never Smoker   . Smokeless tobacco: Never Used  . Alcohol Use: 0.0 oz/week    0 Standard drinks or  equivalent per week     Comment: 2 glasses wine 3 times a week  . Drug Use: No  . Sexual Activity: Not on file   Other Topics Concern  . Not on file   Social History Narrative   Patient is right handed   Patient drinks 3 cups caffeine daily     PHYSICAL EXAM  Filed Vitals:   02/21/15 0837  BP: 101/70  Pulse: 90  Height:  (1.6 m)  Weight: 161 lb (73.029 kg)   Body mass index is 28.53 kg/(m^2).  Generalized: Well developed, in no acute distress  Head: normocephalic and atraumatic,. Oropharynx benign  Neck: Supple, no carotid bruits  Cardiac: Regular rate rhythm, no murmur  Musculoskeletal: No deformity   Neurological examination   Mentation: Alert oriented to time, place, history taking. Attention span and concentration appropriate. Recent and remote memory intact.  Follows all commands speech and language fluent. MOCA 29/30.   Cranial nerve II-XII: Fundoscopic exam reveals sharp disc margins.Pupils were equal round reactive to light extraocular movements were full, visual field were full on confrontational test. Facial sensation and strength were normal. hearing was intact to finger rubbing bilaterally. Uvula tongue midline. head turning and shoulder shrug were normal and symmetric.Tongue protrusion into cheek strength was normal. Motor: normal bulk and tone, full strength in the BUE, BLE, fine finger movements normal, no pronator drift. No focal weakness Sensory: normal and symmetric to light touch, pinprick, and  Vibration, proprioception  Coordination: finger-nose-finger, heel-to-shin bilaterally, no dysmetria Reflexes: Brachioradialis 2/2, biceps 2/2, triceps 2/2, patellar 2/2, Achilles 2/2, plantar responses were flexor bilaterally. Gait and Station: Rising up from seated position without assistance, normal stance,  moderate stride, good arm swing, smooth turning, able to perform tiptoe, and heel walking without difficulty. Tandem gait is steady  DIAGNOSTIC DATA  (LABS, IMAGING, TESTING) - Lab Results  Component Value Date   VITAMINB12 1234* 09/19/2014   Lab Results  Component Value Date   TSH 2.370 09/19/2014      ASSESSMENT AND PLAN  59 y.o. year old female  has a past medical history of Depression and memory disturbance here to follow-up.MRI of the brain was abnormal showing nonspecific white matter changes in both hemispheres. She was set up for an LP to rule out multiple sclerosis,0 oligoclonal bands were found, therefore no evidence of demyelinating disease. In addition she had neuropsychological testing with the conclusion of she had overlying depression as the major etiology for her cognitive slowing.   MOCA score stable will follow over time F/U yearly Nilda Riggs, [facility], [facility], APRN  [facility] [address] [place], [place] [address] [phone]

## 2015-02-21 NOTE — Patient Instructions (Signed)
MOCA score stable F/U yearly

## 2015-02-22 NOTE — Progress Notes (Signed)
I have read the note, and I agree with the clinical assessment and plan.  Devondre Guzzetta KEITH   

## 2016-02-11 ENCOUNTER — Other Ambulatory Visit: Payer: Self-pay | Admitting: Family Medicine

## 2016-02-11 ENCOUNTER — Other Ambulatory Visit (HOSPITAL_COMMUNITY)
Admission: RE | Admit: 2016-02-11 | Discharge: 2016-02-11 | Disposition: A | Discharge status: Home / Self Care | Payer: BLUE CROSS/BLUE SHIELD | Source: Ambulatory Visit | Attending: Family Medicine | Admitting: Family Medicine

## 2016-02-11 DIAGNOSIS — Z1151 Encounter for screening for human papillomavirus (HPV): Secondary | ICD-10-CM | POA: Insufficient documentation

## 2016-02-11 DIAGNOSIS — Z124 Encounter for screening for malignant neoplasm of cervix: Secondary | ICD-10-CM | POA: Insufficient documentation

## 2016-02-13 LAB — CYTOLOGY - PAP

## 2016-02-21 ENCOUNTER — Ambulatory Visit (INDEPENDENT_AMBULATORY_CARE_PROVIDER_SITE_OTHER): Payer: BLUE CROSS/BLUE SHIELD | Admitting: Nurse Practitioner

## 2016-02-21 ENCOUNTER — Encounter: Payer: Self-pay | Admitting: Nurse Practitioner

## 2016-02-21 VITALS — BP 86/62 | HR 80 | Ht 63.0 in | Wt 151.8 lb

## 2016-02-21 DIAGNOSIS — R930 Abnormal findings on diagnostic imaging of skull and head, not elsewhere classified: Secondary | ICD-10-CM

## 2016-02-21 DIAGNOSIS — F32A Depression, unspecified: Secondary | ICD-10-CM | POA: Insufficient documentation

## 2016-02-21 DIAGNOSIS — R413 Other amnesia: Secondary | ICD-10-CM | POA: Diagnosis not present

## 2016-02-21 DIAGNOSIS — R9089 Other abnormal findings on diagnostic imaging of central nervous system: Secondary | ICD-10-CM

## 2016-02-21 DIAGNOSIS — F329 Major depressive disorder, single episode, unspecified: Secondary | ICD-10-CM

## 2016-02-21 NOTE — Patient Instructions (Signed)
MOCA score stable will follow over time Recommend berievement counseling F/U yearly

## 2016-02-21 NOTE — Progress Notes (Signed)
[facility]  PATIENT: Mandalyn Klang DOB: Dec 04, 1955   REASON FOR VISIT: Follow-up for memory, history of depression, bipolar disorder, ADD HISTORY FROM: Patient    HISTORY OF PRESENT ILLNESS: HISTORY:KWMs. Howard Pouchseron is a 60 year old right-handed white female with a history of some issues with memory that she indicates has been significant since March 2015. The patient reports difficulty with focusing, and staying on task. She is easily distracted, and cannot complete tasks. The patient goes on to say that she has had problems with procrastinating in getting things done, she has not paid her taxes in over 4 years. She does report some troubles with memory, she cannot remember her password for the computer, she has difficulty remembering her own address and her phone number. The patient may have to go and look at her driver's license to remember her living address. She denies any balance issues or problems with numbness or weakness on the arms, legs, or face. She denies any problems controlling the bowels or the bladder. She indicates that her sister is hyperactive, and may have ADD. The patient has had issues with depression and anxiety. She has been seeing Dr. Evelene CroonKaur, and she recently was placed on Adderall with some good benefit. She is also on an antidepressant. Her mother died about 2 weeks ago, and she is getting over this loss. She is currently taking a leave of absence from work, as she has not been functioning well at work. Her productivity level has dropped off significantly. She is sent to this office for further evaluation. UPDATE 02/21/15 Ms.Fodera 60 year old female returns for follow-up. She was last seen in this office in initially evaluated for memory disturbance by Dr. Anne HahnWillis 09/19/2014. MRI of the brain was abnormal showing nonspecific white matter changes in both hemispheres. She was set up for an LP to rule out multiple sclerosis,0 oligoclonal bands were found,  therefore no evidence of demyelinating disease. In addition she had neuropsychological testing with the conclusion of she had overlying depression as the major etiology for her cognitive slowing. She has since been placed on lithium by her psychiatrist and she feels much better she returned to work. She also sees a Veterinary surgeoncounselor. She returns for reevaluation  UPDATE 02/21/16 CM Ms. Goodness, 60 year old female returns for follow-up. She is followed yearly for memory loss however she also has major depression and is seen by psychiatry and counseling. She reports that her memory is better. She is now working.  Neuropsych testing in the past concluded  that the major etiology for her cognitive slowing was depression. Her mother recently died after a sudden illness . She returns for reevaluation  REVIEW OF SYSTEMS: Full 14 system review of systems performed and notable only for those listed, all others are neg:  Constitutional: neg  Cardiovascular: Palpitations Ear/Nose/Throat: neg  Skin: neg Eyes: neg Respiratory: neg Gastroitestinal: neg  Hematology/Lymphatic: neg  Endocrine: neg Musculoskeletal:neg Allergy/Immunology: neg Neurological: Tremors Psychiatric: Depression and anxiety, panic attacks Sleep : neg   ALLERGIES: Allergies  Allergen Reactions  . Sulfa Antibiotics Rash    HOME MEDICATIONS: Outpatient Prescriptions Prior to Visit  Medication Sig Dispense Refill  . fexofenadine (ALLEGRA) 180 MG tablet Take 180 mg by mouth daily.    Marland Kitchen. lamoTRIgine (LAMICTAL) 150 MG tablet Take 150 mg by mouth daily.    Marland Kitchen. lithium carbonate (ESKALITH) 450 MG CR tablet Take 450 mg by mouth at bedtime.  1  . amphetamine-dextroamphetamine (ADDERALL) 20 MG tablet Take 20 mg by mouth daily.  0  No facility-administered medications prior to visit.    PAST MEDICAL HISTORY: Past Medical History  Diagnosis Date  . Depression   . Diverticulitis   . Bipolar 1 disorder (HCC)     PAST SURGICAL HISTORY: Past  Surgical History  Procedure Laterality Date  . Appendectomy  1990  . Cholecystectomy  2009  . Sinus endo w/fusion  1991    FAMILY HISTORY: Family History  Problem Relation Age of Onset  . Pulmonary embolism Father   . Alzheimer's disease Father   . Healthy Sister     SOCIAL HISTORY: Social History   Social History  . Marital Status: Married    Spouse Name: N/A  . Number of Children: 2  . Years of Education: BS   Occupational History  . [employer]    Social History Main Topics  . Smoking status: Never Smoker   . Smokeless tobacco: Never Used  . Alcohol Use: 0.0 oz/week    0 Standard drinks or equivalent per week     Comment: 2 glasses wine 3 times a week  . Drug Use: No  . Sexual Activity: Not on file   Other Topics Concern  . Not on file   Social History Narrative   Patient is right handed   Patient drinks 3 cups caffeine daily     PHYSICAL EXAM  Filed Vitals:   02/21/16 0811  BP: 86/62  Pulse: 80  Height: 5\' 3"  (1.6 m)  Weight: 151 lb 12.8 oz (68.856 kg)   Body mass index is 26.9 kg/(m^2). Generalized: Well developed, in no acute distress  Head: normocephalic and atraumatic,. Oropharynx benign  Neck: Supple, no carotid bruits  Cardiac: Regular rate rhythm, no murmur  Musculoskeletal: No deformity   Neurological examination   Mentation: Alert oriented to time, place, history taking. Attention span and concentration appropriate. Recent and remote memory intact. Follows all commands speech and language fluent. MOCA 30/30.   Cranial nerve II-XII: Fundoscopic exam reveals sharp disc margins.Pupils were equal round reactive to light extraocular movements were full, visual field were full on confrontational test. Facial sensation and strength were normal. hearing was intact to finger rubbing bilaterally. Uvula tongue midline. head turning and shoulder shrug were normal and symmetric.Tongue protrusion into cheek strength was normal. Motor: normal bulk and  tone, full strength in the BUE, BLE, fine finger movements normal, no pronator drift. No focal weakness Sensory: normal and symmetric to light touch, pinprick, and Vibration, proprioception  Coordination: finger-nose-finger, heel-to-shin bilaterally, no dysmetria Reflexes: Brachioradialis 2/2, biceps 2/2, triceps 2/2, patellar 2/2, Achilles 2/2, plantar responses were flexor bilaterally. Gait and Station: Rising up from seated position without assistance, normal stance, moderate stride, good arm swing, smooth turning, able to perform tiptoe, and heel walking without difficulty. Tandem gait is steady  DIAGNOSTIC DATA (LABS, IMAGING, TESTING) - ASSESSMENT AND PLAN 60 y.o. year old female has a past medical history of Depression and memory disturbance here to follow-up.MRI of the brain was abnormal showing nonspecific white matter changes in both hemispheres. She was set up for an LP to rule out multiple sclerosis,0 oligoclonal bands were found, therefore no evidence of demyelinating disease. In addition she had neuropsychological testing with the conclusion of she had overlying depression as the major etiology for her cognitive slowing.   MOCA score stable will follow over time Recommend berievement counseling F/U yearly Nilda RiggsNancy Carolyn Peggyann Zwiefelhofer, [facility], [facility], APRN  [facility] [address], [address] [place], [place] [id number] [phone]

## 2016-06-18 ENCOUNTER — Encounter: Payer: Self-pay | Admitting: Allergy & Immunology

## 2016-06-18 ENCOUNTER — Ambulatory Visit (INDEPENDENT_AMBULATORY_CARE_PROVIDER_SITE_OTHER): Payer: BLUE CROSS/BLUE SHIELD | Admitting: Allergy & Immunology

## 2016-06-18 ENCOUNTER — Encounter (INDEPENDENT_AMBULATORY_CARE_PROVIDER_SITE_OTHER): Payer: Self-pay

## 2016-06-18 VITALS — BP 120/60 | HR 71 | Temp 98.4°F | Resp 18 | Ht 63.5 in | Wt 155.8 lb

## 2016-06-18 DIAGNOSIS — J4540 Moderate persistent asthma, uncomplicated: Secondary | ICD-10-CM

## 2016-06-18 DIAGNOSIS — T781XXD Other adverse food reactions, not elsewhere classified, subsequent encounter: Secondary | ICD-10-CM | POA: Diagnosis not present

## 2016-06-18 DIAGNOSIS — T7819XD Other adverse food reactions, not elsewhere classified, subsequent encounter: Secondary | ICD-10-CM

## 2016-06-18 DIAGNOSIS — K219 Gastro-esophageal reflux disease without esophagitis: Secondary | ICD-10-CM

## 2016-06-18 DIAGNOSIS — J310 Chronic rhinitis: Secondary | ICD-10-CM

## 2016-06-18 NOTE — Patient Instructions (Addendum)
1. Moderate persistent asthma - Start Asmanex 100mcg two puffs twice daily. - Use spacer every time. - Lung function showed no reversibility, but you may still have asthma. - We can discuss changing medications at the next visit if needed.   2. Adverse food reaction - Testing was negative to wheat, milk, red meats, and shellfish - Very often causes thickened mucus in certain individuals, but this is not an allergy. - Continue to keep a food diary with symptoms and we can do more targeted testing at future visits.  - We will get blood testing for milk and shellfish.   3. Chronic rhinitis, unspecified type - Testing was positive to: everything - We will get blood work to look for environmental allergens. - Restart Flonase two sprays per nostril twice daily. - Restart Allegra 180mg  once daily. - Consider starting allergy shots if no improvement in the future.   4. GERD - Continue with omeprazole at least until the next appointment.  - We can talk about stopping this at the next visit if no improvement.    5. Return in about 4 weeks (around 07/16/2016).  Please inform us of any Emergency Department visits, hospitalizations, or changes in symptoms. Call us before going to the ED for breathing or allergy symptoms since we might be able to fit you in for a sick visit. Feel free to contact us anytime with any questions, problems, or concerns.  It was a pleasure to meet you today!   Websites that have reliable patient information: 1. American Academy of Asthma, Allergy, and Immunology: www.aaaai.org 2. Food Allergy Research and Education (FARE): foodallergy.org 3. Mothers of Asthmatics: http://www.asthmacommunitynetwork.org 4. American College of Allergy, Asthma, and Immunology: www.acaai.org

## 2016-06-18 NOTE — Progress Notes (Signed)
NEW PATIENT  Date of Service/Encounter:  06/18/16   Assessment:   Moderate persistent asthma, uncomplicated - Plan: Spirometry with Graph  Adverse food reaction, subsequent encounter - Plan: Allergy Test, Allergy-Shellfish Panel, Milk IgE, Wheat IgE  Chronic rhinitis, unspecified type - Plan: Allergy Test, Allergen Zone 3  Gastroesophageal reflux disease, esophagitis presence not specified   Asthma Reportables:  Severity: moderate persistent  Risk: medium Control: not well controlled  Seasonal Influenza Vaccine: no but encouraged    Plan/Recommendations:   1. Moderate persistent asthma - Start Asmanex 139mg two puffs twice daily. - Use spacer every time. - Lung function showed no reversibility, but you may still have asthma. - She did have a 36% improvement in the FEF 25-75 percent, which some consider significant for a positive bronchodilator challenge. - Sample provided for the inhaled steroids so that she would not have to purchase one if we decided not to continue. - We can discuss changing medications at the next visit if needed.  - Could consider a one to two-week course of prednisone to see if her cough is responsive to this. - I would strongly consider seeing Dr. BRedmond Basemanagain to see whether an endoscopy was warranted.   2. Adverse food reaction - Testing was negative to wheat, milk, red meats, and shellfish - Very often milk causes thickened mucus in certain individuals, but this is not an allergy. - Continue to keep a food diary with symptoms and we can do more targeted testing at future visits.  - We will get blood testing for milk, shellfish, and weak. - Red meat is unlikely to be an allergy, as there is no scientific basis for reflexology testing.   3. Chronic rhinitis, unspecified type - Testing was positive to: nothing - We will get blood work to look for environmental allergens. - I was surprised with the negative testing as her symptoms are responsive  to antihistamines, which points to an allergic process. - This could be local allergic rhinitis, in which IgE secreting B cells are located only within the nasal cavity, and therefore are not picked up on routine skin testing. - Testing for local allergic rhinitis is only done in scientific setting such as academic centers. - Restart Flonase two sprays per nostril twice daily. - Restart Allegra 1828monce daily. - Consider starting allergy shots, if we can figure out anything that she is allergic to.  4. GERD - Continue with omeprazole at least until the next appointment.  - We can talk about stopping this at the next visit if no improvement.   - She did report improvement when using the omeprazole, therefore I think this is likely related to her current symptoms.   5. Return in about 4 weeks (around 07/16/2016).    Subjective:   MaNichol Ators a [age].o. female presenting today for evaluation of  Chief Complaint  Patient presents with  . Cough    Productive cough.  Worse at night.  Sudafed does help some.  . Wheezing    Used Albuterol last night.  Wheezing is mainly at night.  . Nasal Congestion   Clemmie Wyre has a history of the following: Patient Active Problem List   Diagnosis Date Noted  . Depression 02/21/2016  . Abnormal finding on MRI of brain 10/19/2014  . Memory changes 09/19/2014    History obtained from: chart review and patient.  [facility]     MaCharleenes a [age].o. female presenting  for a chronic cough and sinus problems. She report that it started around July 2016. At that time, she was diagnosed with the flu although she is unsure whether she was swabbed or not. She was not hospitalized but missed ten days of work. She was told that she would have a persistent cough but it never went away.   Since the cough persisted, she has had multiple chest x-rays that have been normal. She does have albuterol which was  prescribed years ago. Albuterol does seem to help her "wheezing", but her cough continues. She has never been on a daily controller medication for her coughing or wheezing. She has been on at least 2 rounds of antibiotics in the past year to treat this cough, without improvement. She was told at one point that it might be pneumonia, however nothing is seen on chest x-rays. She has also had a sinus infection or 2 in the past calendar year. She never gets prednisone for her wheezing.  She has been evaluated by ENT. She saw Dr. Redmond Baseman, but did not have a laryngoscopy according to the patient. She had a sinus CT in May that she said was normal. However reviewing and in the system and shows that she had chronic pansinusitis which is unchanged since January 2016. Per the telephone note from Dr. Redmond Baseman dated 01/30/16: "CT results to her demonstrating fairly stable sinuses with post-op changes and bony thickening consistent with history of chronic sinusitis. There do not appear to be acute sinus findings." She has never been allergy tested in the past. She was started on Flonase 2 sprays per nostril twice per day by her primary care physician, which has helped. She is on Allegra one tablet daily which also helps. Dr. Redmond Baseman felt that reflux may be contributing to her symptoms, therefore he gave her omeprazole to take daily. She has not been extremely compliant with this, but does think that the cough improves if she takes the omeprazole on a daily basis. She reports that she feels a "bubbling thing" in her throat when she takes the omeprazole.  Sharyn Lull does have a history of allergy symptoms which worsened in the allergy season. She has chronic rhinosinusitis, which requires antibiotic treatment 2-3 times per year. She has never had any sinus surgeries. She has treated her symptoms with multiple medications in the past. Currently, she is using Allegra every day. She is also using Flonase nasal spray 2 sprays per nostril  twice per day. At night she takes Sudafed and Benadryl together. She has been on this regimen for several months with good results. However, she prefers not to be on medication.  She does endorse increased mucus production with milk. She also has oral swelling and itching to crab and lobster as well as wheat. She had reflexology testing performed by a homeopathic doctor at some point that showed positive results to red meats. However she has no clinical symptoms to red meats and does not avoid them. She has never needed epinephrine and has never had testing.   Otherwise, there is no history of other atopic diseases, including asthma, drug allergies, food allergies, environmental allergies, stinging insect allergies, or urticaria. There is no significant infectious history aside from that mentioned above. She denies viral infections and infections in other areas of the body aside from the sinuses and airways. Vaccinations are up to date.    Past Medical History: Patient Active Problem List   Diagnosis Date Noted  . Depression 02/21/2016  . Abnormal finding  on MRI of brain 10/19/2014  . Memory changes 09/19/2014    Medication List:    Medication List       Accurate as of 06/18/16  2:02 PM. Always use your most recent med list.          benzonatate 200 MG capsule Commonly known as:  TESSALON TAKE ONE CAPSULE 3 TIMES A DAY PRN   diazepam 2 MG tablet Commonly known as:  VALIUM as needed.   EVEKEO 10 MG Tabs Generic drug:  Amphetamine Sulfate Takes 54m po daily, another 154mif needed.   fexofenadine 180 MG tablet Commonly known as:  ALLEGRA Take 180 mg by mouth daily.   fluticasone 50 MCG/ACT nasal spray Commonly known as:  FLONASE Place 2 sprays into both nostrils daily.   ketotifen 0.025 % ophthalmic solution Commonly known as:  ZADITOR 2 drops 2 (two) times daily.   lamoTRIgine 150 MG tablet Commonly known as:  LAMICTAL Take 150 mg by mouth daily.   levothyroxine  25 MCG tablet Commonly known as:  SYNTHROID, LEVOTHROID   lithium 300 MG tablet   omeprazole 40 MG capsule Commonly known as:  PRILOSEC TAKE 1 CAPSULE (40 MG TOTAL) BY MOUTH 2 TIMES DAILY BEFORE MEALS.   PROVENTIL HFA 108 (90 Base) MCG/ACT inhaler Generic drug:  albuterol Inhale 2 puffs into the lungs every 6 (six) hours as needed for wheezing or shortness of breath.   Vitamin D (Ergocalciferol) 50000 units Caps capsule Commonly known as:  DRISDOL       Birth History: non-contributory. Born at term without complications.   Developmental History: MaJhanaas met all milestones on time. She has required no speech therapy, occupational therapy, or physical therapy.   Past Surgical History: Past Surgical History:  Procedure Laterality Date  . APPENDECTOMY  1990  . CHOLECYSTECTOMY  2009  . SINOSCOPY    . SINUS ENDO W/FUSION  1991     Family History: Family History  Problem Relation Age of Onset  . Pulmonary embolism Father   . Alzheimer's disease Father   . Healthy Sister   . Allergic rhinitis Neg Hx   . Angioedema Neg Hx   . Asthma Neg Hx   . Eczema Neg Hx   . Immunodeficiency Neg Hx   . Urticaria Neg Hx      Social History: Mattie lives at home with Her husband. They live in a [address]ear old home with wet throughout the home. She has radiant steam heating and central cooling. There are 2 dogs in the home. They do not use dust mite covers on her bedding. 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 Following therefore she did work for the [employer] and [employer] division. 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   Review of Systems: a 14-point review of systems is pertinent for what is mentioned in HPI.  Otherwise, all other systems were negative. Constitutional: negative other than that listed in the HPI Eyes: negative other than that listed in the HPI Ears, nose, mouth, throat, and  face: negative other than that listed in the HPI Respiratory: negative other than that listed in the HPI Cardiovascular: negative other than that listed in the HPI Gastrointestinal: negative other than that listed in the HPI Genitourinary: negative other than that listed in the HPI Integument: negative other than that listed in the HPI Hematologic: negative other than that listed in the HPI Musculoskeletal: negative  other than that listed in the HPI Neurological: negative other than that listed in the HPI Allergy/Immunologic: negative other than that listed in the HPI    Objective:   Blood pressure 120/60, pulse 71, temperature 98.4 F (36.9 C), temperature source Oral, resp. rate 18, height 5' 3.5" (1.613 m), weight 155 lb 12.8 oz (70.7 kg), SpO2 97 %. Body mass index is 27.17 kg/m.   Physical Exam:  General: Alert, interactive, in no acute distress. Cooperative with the exam. Very personable.  HEENT: TMs pearly gray, turbinates edematous and pale with clear discharge, post-pharynx erythematous. Neck: Supple without thyromegaly. Adenopathy: no enlarged lymph nodes appreciated in the anterior cervical, occipital, axillary, epitrochlear, inguinal, or popliteal regions Lungs: Clear to auscultation without wheezing, rhonchi or rales. No increased work of breathing. CV: Normal S1/S2, no murmurs. Capillary refill <2 seconds.  Abdomen: Nondistended, nontender. No guarding or rebound tenderness. Bowel sounds faint and hypoactive  Skin: Warm and dry, without lesions or rashes. Extremities:  No clubbing, cyanosis or edema. Neuro:   Grossly intact. No focal deficits noted.   Diagnostic studies:  Spirometry: results abnormal (FEV1: 1.92/80%, FVC: 2.24/76%, FEV1/FVC: 85%).    Spirometry consistent with possible restrictive disease. DuoNeb nebulizer treatment given in clinic with no improvement. Her FVC and FEV1 essentially did not change. However, there was 36% improvement in the FEF 25-75  percent. Some guidelines to consider this to be significant for a positive bronchodilator response.  Allergy Studies:   Indoor/Outdoor Percutaneous Adult Environmental Panel: negative to all 59 allergens tested  Indoor/Outdoor Selected Intradermal Environmental Panel:  negative to all 12 intradermal was tested without adequate control  Selected Foods Panel: Negative to wheat, milk, casein, pork, beef, lamb, shrimp, crab, oyster, lobster, and scallops with adequate controls     Salvatore Marvel, MD [facility] [place]

## 2016-07-06 LAB — CP584 ZONE 3
Allergen, Black Locust, Acacia9: <0.1 kU/L
Allergen, Comm Silver Birch, t9: <0.1 kU/L
Allergen, D pternoyssinus,d7: <0.1 kU/L
Allergen, Oak,t7: <0.1 kU/L
Bermuda Grass: <0.1 kU/L
Box Elder IgE: <0.1 kU/L
Cockroach: <0.1 kU/L
Common Ragweed: <0.1 kU/L
D. farinae: <0.1 kU/L
Dog Dander: <0.1 kU/L
Nettle: <0.1 kU/L
Rough Pigweed  IgE: <0.1 kU/L

## 2016-07-06 LAB — ALLERGY-SHELLFISH PANEL
Clams: <0.1 kU/L
Crab: <0.1 kU/L
Lobster: <0.1 kU/L
Shrimp IgE: <0.1 kU/L

## 2016-07-06 LAB — ALLERGEN MILK

## 2016-07-06 LAB — ALLERGEN, WHEAT, F4

## 2016-07-20 ENCOUNTER — Ambulatory Visit (INDEPENDENT_AMBULATORY_CARE_PROVIDER_SITE_OTHER): Payer: BLUE CROSS/BLUE SHIELD | Admitting: Allergy & Immunology

## 2016-07-20 ENCOUNTER — Encounter: Payer: Self-pay | Admitting: Allergy & Immunology

## 2016-07-20 VITALS — BP 102/62 | HR 72 | Resp 16

## 2016-07-20 DIAGNOSIS — K219 Gastro-esophageal reflux disease without esophagitis: Secondary | ICD-10-CM

## 2016-07-20 DIAGNOSIS — J4540 Moderate persistent asthma, uncomplicated: Secondary | ICD-10-CM

## 2016-07-20 DIAGNOSIS — J019 Acute sinusitis, unspecified: Secondary | ICD-10-CM

## 2016-07-20 DIAGNOSIS — T781XXD Other adverse food reactions, not elsewhere classified, subsequent encounter: Secondary | ICD-10-CM | POA: Diagnosis not present

## 2016-07-20 DIAGNOSIS — J300 Vasomotor rhinitis: Secondary | ICD-10-CM

## 2016-07-20 MED ORDER — AMOXICILLIN 875 MG PO TABS: 875.0000 mg | ORAL_TABLET | Freq: Two times a day (BID) | ORAL | 0 refills | Status: AC

## 2016-07-20 MED ORDER — MOMETASONE FUROATE 100 MCG/ACT IN AERO: 2.0000 | INHALATION_SPRAY | Freq: Two times a day (BID) | RESPIRATORY_TRACT | 5 refills | Status: DC

## 2016-07-20 NOTE — Progress Notes (Signed)
FOLLOW UP  Date of Service/Encounter:  07/20/16   Assessment:   Moderate persistent asthma, uncomplicated  Adverse food reaction, subsequent encounter  Gastroesophageal reflux disease, esophagitis presence not specified  Chronic vasomotor rhinitis  Acute sinusitis, recurrence not specified, unspecified location - Plan: Spirometry with Graph   Asthma Reportables:  Severity: moderate persistent  Risk: low Control: well controlled (until she ran out of her controller)   Plan/Recommendations:   1. Moderate persistent asthma, uncomplicated - Daily controller medication(s): Asmanex 100mcg two puffs twice daily with spacer - Rescue medications: albuterol 4 puffs every 4-6 hours as needed - Changes during respiratory infections or worsening symptoms: increase Asmanex 100mcg to 4 puffs twice daily for TWO WEEKS. - Asthma control goals:  * Full participation in all desired activities (may need albuterol before activity) * Albuterol use two time or less a week on average (not counting use with activity) * Cough interfering with sleep two time or less a month * Oral steroids no more than once a year * No hospitalizations  2. Acute sinusitis - Start amoxicillin 875mg  one tablet twice daily for two weeks. - Continue with your extensive sinus clearance regimen.  3. Gastroesophageal reflux disease - Try using the omeprazole on a regular basis for one month to see if this works. - Otherwise, consider going back to see Dr. Jenne PaneBates to see if there is anything else that might improve your symptoms.   4. Chronic vasomotor rhinitis - Continue with Flonase two sprays per nostril daily. - Could consider the addition of a nasal antihistamine, but without evidence of allergies I am unsure it would provide much relief.   5. Adverse food reactions - likely intolerances - Previous testing (skin and blood testing) was negative. - There is no indication for EpiPen at this time.  6. Return in  about 3 months (around 10/20/2016).  Subjective:   Bridget Long is a 60 y.o. female presenting today for follow up of  Chief Complaint  Patient presents with  . Asthma    f/u doing good until the last 3 days  .  Bridget Long has a history of the following: Patient Active Problem List   Diagnosis Date Noted  . Depression 02/21/2016  . Abnormal finding on MRI of brain 10/19/2014  . Memory changes 09/19/2014    History obtained from: chart review and patient.  [name] [facility].     Bridget Long is a 60 y.o. female presenting for a follow up visit. She was last seen in October 2017 approximately one month ago. At that time, she had the lung function consistent with asthma although there was no reversibility in the FEV1 or FVC. She did have 36% improvement in the FEF 25%-75%. I started her on Asmanex 100 g 2 puffs twice daily. We did food testing that was negative to milk, wheat, red meats, and shellfish. We sent blood work to confirm the milk, shellfish, wheat allergies, all of which were negative. We also did environmental allergy testing that showed negatives to the entire 59 allergen panel and blood work confirmed that all were negative. I recommended restarting Flonase two sprays per nostril daily as well as Allegra 180mg  one tablet daily. I provided a PPI to see if this would help her symptoms as well (omeprazole).   Since the last visit, she has done well until the last three nights. She remained on the Asmanex, which was only a sample that we provided, and felt that it helped tremendously.  She has not required rescue medication, experienced nocturnal awakenings due to lower respiratory symptoms, nor have activities of daily living been limited. However, over the last three days, she has had problems with shortness of breath and cough. She thinks that she might have run out of her Asmanex. She was not aware that it had a counter on the back of it. Since  Wednesday, she also caught an upper respiratory infection. Albuterol does help, and she has been using more in the past few days. She felt that she was getting over the illness over the weekend but the symptoms returned late yesterday and into today.   She remains on her extensive nasal regimen including nasal saline rinses, Flonase, and Mucinex to keep "the nose running". She feels that she has done particularly well over the past 21 days, as she has been on a detox diet in which she increases her intake of kale, whole grains, fruits, and vegetables. This diet is "approved by chiropractors". Her reflux seems to be under good control, although she has stopped taking her omeprazole regularly. She again describes an upper throat "gurgling" noise that is worse at night and in the morning.   Otherwise, there have been no changes to her past medical history, surgical history, family history, or social history.    Review of Systems: a 14-point review of systems is pertinent for what is mentioned in HPI.  Otherwise, all other systems were negative. Constitutional: negative other than that listed in the HPI Eyes: negative other than that listed in the HPI Ears, nose, mouth, throat, and face: negative other than that listed in the HPI Respiratory: negative other than that listed in the HPI Cardiovascular: negative other than that listed in the HPI Gastrointestinal: negative other than that listed in the HPI Genitourinary: negative other than that listed in the HPI Integument: negative other than that listed in the HPI Hematologic: negative other than that listed in the HPI Musculoskeletal: negative other than that listed in the HPI Neurological: negative other than that listed in the HPI Allergy/Immunologic: negative other than that listed in the HPI    Objective:   Blood pressure 102/62, pulse 72, resp. rate 16. There is no height or weight on file to calculate BMI.   Physical Exam:  General:  Alert, interactive, in no acute distress. Cooperative with the exam. Friendly but somewhat skeptical to my recommendations. HEENT: TMs pearly gray, turbinates edematous and pale with clear discharge, post-pharynx erythematous.  Neck: Supple without thyromegaly. Lungs: Clear to auscultation without wheezing, rhonchi or rales. No increased work of breathing. CV: Normal S1/S2, no murmurs. Capillary refill <2 seconds.  Abdomen: Nondistended, nontender. No guarding or rebound tenderness. Bowel sounds present in all fields and hypoactive  Skin: Warm and dry, without lesions or rashes. Extremities:  No clubbing, cyanosis or edema. Neuro:   Grossly intact.  Diagnostic studies:  Spirometry: results abnormal (FEV1: 1.97/79%, FVC: 2.23/73%, FEV1/FVC: 88%).    Spirometry consistent with possible restrictive disease. DuoNeb nebulizer treatment given in clinic with significant improvement on with the FEF25-75% of 17%, but was otherwise normal.   Allergy Studies: None     Malachi BondsJoel Eugune Sine, MD [facility]

## 2016-07-20 NOTE — Patient Instructions (Addendum)
1. Moderate persistent asthma, uncomplicated - Daily controller medication(s): Asmanex 100mcg two puffs twice daily with spacer - Rescue medications: albuterol 4 puffs every 4-6 hours as needed - Changes during respiratory infections or worsening symptoms: increase Asmanex 100mcg to 4 puffs twice daily for TWO WEEKS. - Asthma control goals:  * Full participation in all desired activities (may need albuterol before activity) * Albuterol use two time or less a week on average (not counting use with activity) * Cough interfering with sleep two time or less a month * Oral steroids no more than once a year * No hospitalizations  2. Acute sinusitis - Start amoxicillin 875mg  one tablet twice daily for two weeks. - Continue with your extensive sinus clearance regimen.  3. Gastroesophageal reflux disease - Try using the omeprazole on a regular basis for one month to see if this works. - Otherwise, consider going back to see Dr. Jenne PaneBates to see if there is anything else that you think might work.   4. Chronic vasomotor rhinitis - Continue with Flonase two sprays per nostril daily. - Could consider the addition of a nasal antihistamine, but without evidence of allergies I am unsure it would provide much relief.   5. Adverse food reactions - likely intolerances - Previous testing (skin and blood testing) was negative. - There is no indication for EpiPen at this time.  6. Return in about 3 months (around 10/20/2016).  Please inform us of any Emergency Department visits, hospitalizations, or changes in symptoms. Call us before going to the ED for breathing or allergy symptoms since we might be able to fit you in for a sick visit. Feel free to contact us anytime with any questions, problems, or concerns.  It was a pleasure to see you again today! Have a fantastic Thanksgiving!   Websites that have reliable patient information: 1. American Academy of Asthma, Allergy, and Immunology: www.aaaai.org 2.  Food Allergy Research and Education (FARE): foodallergy.org 3. Mothers of Asthmatics: http://www.asthmacommunitynetwork.org 4. American College of Allergy, Asthma, and Immunology: www.acaai.org

## 2017-02-19 ENCOUNTER — Ambulatory Visit: Payer: BLUE CROSS/BLUE SHIELD | Admitting: Nurse Practitioner

## 2017-10-26 ENCOUNTER — Other Ambulatory Visit: Payer: No Typology Code available for payment source

## 2017-10-26 ENCOUNTER — Encounter: Payer: Self-pay | Admitting: Pulmonary Disease

## 2017-10-26 ENCOUNTER — Ambulatory Visit (INDEPENDENT_AMBULATORY_CARE_PROVIDER_SITE_OTHER): Payer: No Typology Code available for payment source | Admitting: Pulmonary Disease

## 2017-10-26 VITALS — BP 136/78 | HR 82 | Ht 63.0 in | Wt 147.0 lb

## 2017-10-26 DIAGNOSIS — R05 Cough: Secondary | ICD-10-CM

## 2017-10-26 DIAGNOSIS — J329 Chronic sinusitis, unspecified: Secondary | ICD-10-CM

## 2017-10-26 DIAGNOSIS — J479 Bronchiectasis, uncomplicated: Secondary | ICD-10-CM

## 2017-10-26 DIAGNOSIS — R059 Cough, unspecified: Secondary | ICD-10-CM

## 2017-10-26 MED ORDER — SODIUM CHLORIDE 3 % IN NEBU: INHALATION_SOLUTION | Freq: Two times a day (BID) | RESPIRATORY_TRACT | 11 refills | Status: AC

## 2017-10-26 MED ORDER — ALBUTEROL SULFATE (2.5 MG/3ML) 0.083% IN NEBU: 2.5000 mg | INHALATION_SOLUTION | Freq: Two times a day (BID) | RESPIRATORY_TRACT | 11 refills | Status: AC

## 2017-10-26 NOTE — Progress Notes (Signed)
Subjective:   PATIENT ID: Bridget Long GENDER: female DOB: 01/31/1956, MRN: [id number]  Synopsis: Referred in Feb 2019 for Cough  HPI  Chief Complaint  Patient presents with  . Advice Only    referred for a persistent prod cough X2 years.    [name] is here to see me for cough.  [facility].  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 Jenne Pane tested her for allergies, he said that the test was normal > Dr. Jenne Pane says that she has inflamed sinuses which looks "typical for someone who has had surgery" > she is still having problems with sinus disease, though she has noted improved symptoms since starting a nasal rinse and having a round of antibiotics in May 2018 > Dr. Jenne Pane thought she had acid reflux  She was also seen by an allergist: > she was put on Asmanex > this helped a little > she was able to sleep at this point  Her overwhelming problem is that she can't sleep at night because of the cough. > she has to sit up to help reduce the cough > She says that this is her primary problem  She lived in [place], [place] and [place].     When she was a kid she had allergies.    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  She said that typically she would have sinus infections when she would travel and sometimes this would turn into pneumonia.    No first degree relatives have lung disease.     Past Medical History:  Diagnosis Date  . Bipolar 1 disorder (HCC)   . Depression   . Diverticulitis      Family History  Problem Relation Age of Onset  . Pulmonary embolism Father   . Alzheimer's disease Father   . Healthy Sister   . Allergic rhinitis Neg Hx   . Angioedema Neg Hx   . Asthma Neg Hx   . Eczema Neg Hx   . Immunodeficiency Neg Hx   . Urticaria Neg Hx      Social History   Socioeconomic History  . Marital status: Married    Spouse name: Not on file  . Number of children: 2  . Years of education: BS  . Highest education level: Not on file  Social Needs  . Financial resource strain: Not on file  . Food insecurity - worry: Not on file  . Food insecurity - inability: Not on file  .  Transportation needs - medical: Not on file  . Transportation needs - non-medical: Not on file  Occupational History  . Occupation: [employer]  Tobacco Use  . Smoking status: Never Smoker  . Smokeless tobacco: Never Used  Substance and Sexual Activity  . Alcohol use: Yes    Alcohol/week: 0.0 oz    Comment: 2 glasses wine 3 times a week  . Drug use: No  . Sexual activity: Not on file  Other Topics Concern  . Not on file  Social History Narrative   Patient is right handed   Patient drinks 3 cups caffeine daily     Allergies  Allergen Reactions  . Sulfa Antibiotics Rash     Outpatient Medications Prior to Visit  Medication Sig Dispense Refill  . albuterol (PROVENTIL HFA) 108 (90 Base) MCG/ACT inhaler Inhale 2 puffs into the lungs every 6 (six) hours as needed for wheezing or shortness of breath.    . benzonatate (TESSALON) 200 MG capsule TAKE ONE CAPSULE 3 TIMES A DAY PRN  3  . cholecalciferol  (VITAMIN D) 1000 units tablet Take 1,000 Units by mouth daily.    . fexofenadine (ALLEGRA) 180 MG tablet Take 180 mg by mouth daily.    Marland Kitchen. ketotifen (ZADITOR) 0.025 % ophthalmic solution 2 drops 2 (two) times daily.    Marland Kitchen. lamoTRIgine (LAMICTAL) 150 MG tablet Take 150 mg by mouth daily.    Marland Kitchen. lithium 300 MG tablet Take 300 mg by mouth daily.   4  . Mometasone Furoate (ASMANEX HFA) 100 MCG/ACT AERO Inhale 2 puffs into the lungs 2 (two) times daily. 1 Inhaler 5  . Omega-3 1000 MG CAPS Take 1 capsule by mouth daily.    Marland Kitchen. omeprazole (PRILOSEC) 40 MG capsule TAKE 1 CAPSULE (40 MG TOTAL) BY MOUTH 2 TIMES DAILY BEFORE MEALS.  5  . diazepam (VALIUM) 2 MG tablet as needed.   0  . EVEKEO 10 MG TABS Takes 10mg  po daily, another 10mg  if needed.  0  . fluticasone (FLONASE) 50 MCG/ACT nasal spray Place 2 sprays into both nostrils daily.    Marland Kitchen. levothyroxine (SYNTHROID, LEVOTHROID) 25 MCG tablet   11  . Vitamin D, Ergocalciferol, (DRISDOL) 50000 units CAPS capsule   0   No facility-administered medications prior to visit.     Review of Systems  Constitutional: Negative for fever and weight loss.  HENT: Positive for congestion. Negative for ear pain, nosebleeds and sore throat.   Eyes: Negative for redness.  Respiratory: Positive for cough and shortness of breath. Negative for wheezing.   Cardiovascular: Negative for palpitations, leg swelling and PND.  Gastrointestinal: Negative for nausea and vomiting.  Genitourinary: Negative for dysuria.  Skin: Negative for rash.  Neurological: Negative for headaches.  Endo/Heme/Allergies: Does not bruise/bleed easily.  Psychiatric/Behavioral: Negative for depression. The patient is not nervous/anxious.       Objective:  Physical Exam   Vitals:   10/26/17 1404  BP: 136/78  Pulse: 82  SpO2: 99%  Weight: 147 lb (66.7 kg)  Height: 5\' 3"  (1.6 m)    Gen: well appearing, no acute distress HENT: NCAT, OP clear, neck supple without masses Eyes: PERRL,  EOMi Lymph: no cervical lymphadenopathy PULM: Crackles R base, no wheezing CV: RRR, no mgr, no JVD GI: BS+, soft, nontender, no hsm Derm: no rash or skin breakdown MSK: normal bulk and tone Neuro: A&Ox4, CN II-XII intact, strength 5/5 in all 4 extremities Psyche: normal mood and affect   CBC No results found for:  WBC, RBC, HGB, HCT, PLT, MCV, MCH, MCHC, RDW, LYMPHSABS, MONOABS, EOSABS, BASOSABS   Chest imaging: 2014 CT chest multiple small pulmonary nodules, mild lymphadenopathy, right upper lobe bronchiectasis and tree-in-bud abnormalities noted  PFT: 2013 pulmonary function test from the [place] [facility] normal, FEV1 2.29 L 97% predicted, FVC 2.96 L 95% predicted, total lung capacity 4.31 L 91% predicted, DLCO 23.9 118% predicted  Labs:  Path:  Echo:  Heart Catheterization:  Other imaging: CT sinuses 2014 showed chronic sinusitis       Assessment & Plan:   Bronchiectasis without complication (HCC)  Cough - Plan: CT Chest High Resolution, Fungus Culture & Smear, Respiratory or Resp and Sputum Culture, AFB Culture & Smear, Ambulatory Referral for DME, Alpha-1 antitrypsin phenotype, IgG, IgA, IgM  Chronic sinusitis, unspecified location  Discussion: This is a pleasant 62 year old lady who has bronchiectasis.  She is coming to my clinic today not previously diagnosed with this condition.  She was sent initially for chronic cough.  However on further history she notes daily mucus production and recurrent infections over the years.  It is not clear to me what caused this.  She does note some distant relatives with a history of lung disease.  With her recurrent sinusitis symptoms I do wonder about an underlying immunoglobulin deficiency.  We need to focus on mucociliary clearance.  We also need to sample her mucus to look for evidence of underlying atypical lung infection.  You have a condition called bronchiectasis: This means that your airways are bigger and more dilated than  normal and are susceptible to infection Because of this we will check sputum cultures to look for evidence of bacterial, fungal, and mycobacterial organisms I would like for you to use hypertonic saline nebulized twice a day to help clear the mucus out of your lungs Use albuterol 5 minutes before the hypertonic saline We will get a high-resolution CT scan of the chest as it has been 5 years since the last one We will check blood work today to look for an underlying cause of your chronic sinus and lung symptoms: Specifically alpha-1 antitrypsin deficiency testing, immunoglobulin deficiency, and IgG subclass deficiency  We will see you back in about 6-8 weeks    Current Outpatient Medications:  .  albuterol (PROVENTIL HFA) 108 (90 Base) MCG/ACT inhaler, Inhale 2 puffs into the lungs every 6 (six) hours as needed for wheezing or shortness of breath., Disp: , Rfl:  .  benzonatate (TESSALON) 200 MG capsule, TAKE ONE CAPSULE 3 TIMES A DAY PRN, Disp: , Rfl: 3 .  cholecalciferol (VITAMIN D) 1000 units tablet, Take 1,000 Units by mouth daily., Disp: , Rfl:  .  fexofenadine (ALLEGRA) 180 MG tablet, Take 180 mg by mouth daily., Disp: , Rfl:  .  ketotifen (ZADITOR) 0.025 % ophthalmic solution, 2 drops 2 (two) times daily., Disp: , Rfl:  .  lamoTRIgine (LAMICTAL) 150 MG tablet, Take 150 mg by mouth daily., Disp: , Rfl:  .  lithium 300 MG tablet, Take 300 mg by mouth daily. , Disp: , Rfl: 4 .  Mometasone Furoate (ASMANEX HFA) 100 MCG/ACT AERO, Inhale 2 puffs into the lungs 2 (two) times daily., Disp: 1 Inhaler, Rfl: 5 .  Omega-3 1000 MG CAPS, Take 1 capsule by mouth daily., Disp: , Rfl:  .  omeprazole (PRILOSEC) 40 MG capsule, TAKE 1 CAPSULE (40 MG TOTAL) BY MOUTH 2 TIMES DAILY BEFORE MEALS., Disp: , Rfl: 5 .  albuterol (PROVENTIL) (2.5 MG/3ML) 0.083% nebulizer solution, Take 3  mLs (2.5 mg total) by nebulization 2 (two) times daily., Disp: 120 mL, Rfl: 11 .  sodium chloride HYPERTONIC 3 % nebulizer  solution, Take by nebulization 2 (two) times daily., Disp: 300 mL, Rfl: 11

## 2017-10-26 NOTE — Patient Instructions (Signed)
You have a condition called bronchiectasis: This means that your airways are bigger and more dilated than normal and are susceptible to infection Because of this we will check sputum cultures to look for evidence of bacterial, fungal, and mycobacterial organisms I would like for you to use hypertonic saline nebulized twice a day to help clear the mucus out of your lungs Use albuterol 5 minutes before the hypertonic saline We will get a high-resolution CT scan of the chest as it has been 5 years since the last one We will check blood work today to look for an underlying cause of your chronic sinus and lung symptoms: Specifically alpha-1 antitrypsin deficiency testing, immunoglobulin deficiency, and IgG subclass deficiency  We will see you back in about 6-8 weeks

## 2017-10-27 ENCOUNTER — Other Ambulatory Visit: Payer: Self-pay

## 2017-10-27 DIAGNOSIS — R0600 Dyspnea, unspecified: Secondary | ICD-10-CM

## 2017-10-28 ENCOUNTER — Other Ambulatory Visit: Payer: No Typology Code available for payment source

## 2017-10-28 ENCOUNTER — Telehealth: Payer: Self-pay | Admitting: Pulmonary Disease

## 2017-10-28 DIAGNOSIS — R0600 Dyspnea, unspecified: Secondary | ICD-10-CM

## 2017-10-28 NOTE — Telephone Encounter (Signed)
Called and spoke with pt letting her know the results of labwork and that BQ wanted it to be repeated due to low Immuglobin results.  Stated to pt the lab orders have been placed and all she needs to do is head downstairs to the basement before 5pm to have the labwork repeated.  Pt expressed understanding. Nothing further needed at this current time.

## 2017-10-29 LAB — IGG, IGA, IGM
IGG (IMMUNOGLOBIN G), SERUM: 348 mg/dL — AB (ref 694–1618)
IgM, Serum: 32 mg/dL — ABNORMAL LOW (ref 48–271)
Immunoglobulin A: 38 mg/dL — ABNORMAL LOW (ref 81–463)

## 2017-11-02 ENCOUNTER — Ambulatory Visit (INDEPENDENT_AMBULATORY_CARE_PROVIDER_SITE_OTHER)
Admission: RE | Admit: 2017-11-02 | Discharge: 2017-11-02 | Disposition: A | Discharge status: Home / Self Care | Payer: No Typology Code available for payment source | Source: Ambulatory Visit | Attending: Pulmonary Disease | Admitting: Pulmonary Disease

## 2017-11-02 DIAGNOSIS — R059 Cough, unspecified: Secondary | ICD-10-CM

## 2017-11-02 DIAGNOSIS — R05 Cough: Secondary | ICD-10-CM | POA: Diagnosis not present

## 2017-11-08 ENCOUNTER — Other Ambulatory Visit: Payer: Self-pay

## 2017-11-08 DIAGNOSIS — D809 Immunodeficiency with predominantly antibody defects, unspecified: Secondary | ICD-10-CM

## 2017-11-08 LAB — IGG SUBCLASSES
IGG SUBCLASS 3: 40 mg/dL (ref 22–178)
IGG SUBCLASS 4: 4 mg/dL (ref 4–86)
IgG Subclass 1: 164 mg/dL — ABNORMAL LOW (ref 382–929)
IgG Subclass 2: 88 mg/dL — ABNORMAL LOW (ref 241–700)
Immunoglobulin G, Serum: 357 mg/dL — ABNORMAL LOW (ref 694–1618)

## 2017-11-08 LAB — IGG, IGA, IGM
IGG (IMMUNOGLOBIN G), SERUM: 340 mg/dL — AB (ref 694–1618)
IGM, SERUM: 30 mg/dL — AB (ref 48–271)
Immunoglobulin A: 37 mg/dL — ABNORMAL LOW (ref 81–463)

## 2017-11-08 LAB — ALPHA-1 ANTITRYPSIN PHENOTYPE: A1 ANTITRYPSIN SER: 161 mg/dL (ref 83–199)

## 2017-12-09 ENCOUNTER — Ambulatory Visit: Payer: No Typology Code available for payment source | Admitting: Pulmonary Disease

## 2018-02-07 ENCOUNTER — Ambulatory Visit: Payer: Self-pay | Admitting: Pulmonary Disease

## 2018-02-07 NOTE — Progress Notes (Deleted)
Subjective:   PATIENT ID: Bridget Long GENDER: female DOB: 08-13-1956, MRN: [id number]  Synopsis: Referred in Feb 2019 for Cough  HPI  No chief complaint on file.  Bridget Long is here to see me for cough.  [facility] her here.  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 Jenne Pane tested her for allergies, he said that the test was normal > Dr. Jenne Pane says that she has inflamed sinuses which looks "typical for someone who has had surgery" > she is still having problems with sinus disease, though she has noted improved symptoms since starting a nasal rinse and having a round of antibiotics in May 2018 > Dr. Jenne Pane thought she had acid reflux  She was also seen by an allergist: > she was put on Asmanex > this helped a little > she was able to sleep at this point  Her overwhelming problem is that she can't sleep at night because of the cough. > she has to sit up to help reduce the cough > She says that this is her primary problem  She lived in [place], [place] and [place].    When she was a kid she had allergies.    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  She said that typically she would have sinus infections when she would travel and sometimes this would turn into pneumonia.    No first degree relatives have lung disease.     Past Medical History:  Diagnosis Date  . Bipolar 1 disorder (HCC)   . Depression   . Diverticulitis      Family History  Problem Relation Age of Onset  . Pulmonary embolism Father   . Alzheimer's disease Father   . Healthy Sister   . Allergic rhinitis Neg Hx   . Angioedema Neg Hx   . Asthma Neg Hx   . Eczema Neg Hx   . Immunodeficiency Neg Hx   . Urticaria Neg Hx      Social History   Socioeconomic History  . Marital status: Married    Spouse name: Not on file  . Number of children: 2  . Years of education: BS  . Highest education level: Not on file  Occupational History  . Occupation: [employer]  Social Needs  . Financial resource strain: Not on file  . Food insecurity:    Worry: Not on file    Inability: Not on file  . Transportation needs:    Medical: Not on file  Non-medical: Not on file  Tobacco Use  . Smoking status: Never Smoker  . Smokeless tobacco: Never Used  Substance and Sexual Activity  . Alcohol use: Yes    Alcohol/week: 0.0 oz    Comment: 2 glasses wine 3 times a week  . Drug use: No  . Sexual activity: Not on file  Lifestyle  . Physical activity:    Days per week: Not on file    Minutes per session: Not on file  . Stress: Not on file  Relationships  . Social connections:    Talks on phone: Not on file    Gets together: Not on file    Attends religious service: Not on file    Active member of club or organization: Not on file    Attends meetings of clubs or organizations: Not on file    Relationship status: Not on file  . Intimate partner violence:    Fear of current or ex partner: Not on file    Emotionally abused: Not on file    Physically abused: Not on file    Forced sexual activity: Not on file  Other Topics Concern  . Not on file    Social History Narrative   Patient is right handed   Patient drinks 3 cups caffeine daily     Allergies  Allergen Reactions  . Sulfa Antibiotics Rash     Outpatient Medications Prior to Visit  Medication Sig Dispense Refill  . albuterol (PROVENTIL HFA) 108 (90 Base) MCG/ACT inhaler Inhale 2 puffs into the lungs every 6 (six) hours as needed for wheezing or shortness of breath.    Marland Kitchen. albuterol (PROVENTIL) (2.5 MG/3ML) 0.083% nebulizer solution Take 3 mLs (2.5 mg total) by nebulization 2 (two) times daily. 120 mL 11  . benzonatate (TESSALON) 200 MG capsule TAKE ONE CAPSULE 3 TIMES A DAY PRN  3  . cholecalciferol (VITAMIN D) 1000 units tablet Take 1,000 Units by mouth daily.    . fexofenadine (ALLEGRA) 180 MG tablet Take 180 mg by mouth daily.    Marland Kitchen. ketotifen (ZADITOR) 0.025 % ophthalmic solution 2 drops 2 (two) times daily.    Marland Kitchen. lamoTRIgine (LAMICTAL) 150 MG tablet Take 150 mg by mouth daily.    Marland Kitchen. lithium 300 MG tablet Take 300 mg by mouth daily.   4  . Mometasone Furoate (ASMANEX HFA) 100 MCG/ACT AERO Inhale 2 puffs into the lungs 2 (two) times daily. 1 Inhaler 5  . Omega-3 1000 MG CAPS Take 1 capsule by mouth daily.    Marland Kitchen. omeprazole (PRILOSEC) 40 MG capsule TAKE 1 CAPSULE (40 MG TOTAL) BY MOUTH 2 TIMES DAILY BEFORE MEALS.  5  . sodium chloride HYPERTONIC 3 % nebulizer solution Take by nebulization 2 (two) times daily. 300 mL 11   No facility-administered medications prior to visit.     Review of Systems  Constitutional: Negative for fever and weight loss.  HENT: Positive for congestion. Negative for ear pain, nosebleeds and sore throat.   Eyes: Negative for redness.  Respiratory: Positive for cough and shortness of breath. Negative for wheezing.   Cardiovascular: Negative for palpitations, leg swelling and PND.  Gastrointestinal: Negative for nausea and vomiting.  Genitourinary: Negative for dysuria.  Skin: Negative for rash.  Neurological: Negative for headaches.   Endo/Heme/Allergies: Does not bruise/bleed easily.  Psychiatric/Behavioral: Negative for depression. The patient is not nervous/anxious.       Objective:  Physical Exam   There were no vitals filed for this visit.  Gen:  well appearing, no acute distress HENT: NCAT, OP clear, neck supple without masses Eyes: PERRL, EOMi Lymph: no cervical lymphadenopathy PULM: Crackles R base, no wheezing CV: RRR, no mgr, no JVD GI: BS+, soft, nontender, no hsm Derm: no rash or skin breakdown MSK: normal bulk and tone Neuro: A&Ox4, CN II-XII intact, strength 5/5 in all 4 extremities Psyche: normal mood and affect   CBC No results found for: WBC, RBC, HGB, HCT, PLT, MCV, MCH, MCHC, RDW, LYMPHSABS, MONOABS, EOSABS, BASOSABS   Chest imaging: 2014 CT chest multiple small pulmonary nodules, mild lymphadenopathy, right upper lobe bronchiectasis and tree-in-bud abnormalities noted  PFT: 2013 pulmonary function test from the [place] [facility] normal, FEV1 2.29 L 97% predicted, FVC 2.96 L 95% predicted, total lung capacity 4.31 L 91% predicted, DLCO 23.9 118% predicted  Labs:  Path:  Echo:  Heart Catheterization:  Other imaging: CT sinuses 2014 showed chronic sinusitis       Assessment & Plan:   No diagnosis found.  Discussion: This is a pleasant 62 year old lady who has bronchiectasis.  She is coming to my clinic today not previously diagnosed with this condition.  She was sent initially for chronic cough.  However on further history she notes daily mucus production and recurrent infections over the years.  It is not clear to me what caused this.  She does note some distant relatives with a history of lung disease.  With her recurrent sinusitis symptoms I do wonder about an underlying immunoglobulin deficiency.  We need to focus on mucociliary clearance.  We also need to sample her mucus to look for evidence of underlying atypical lung infection.  You have a condition called  bronchiectasis: This means that your airways are bigger and more dilated than normal and are susceptible to infection Because of this we will check sputum cultures to look for evidence of bacterial, fungal, and mycobacterial organisms I would like for you to use hypertonic saline nebulized twice a day to help clear the mucus out of your lungs Use albuterol 5 minutes before the hypertonic saline We will get a high-resolution CT scan of the chest as it has been 5 years since the last one We will check blood work today to look for an underlying cause of your chronic sinus and lung symptoms: Specifically alpha-1 antitrypsin deficiency testing, immunoglobulin deficiency, and IgG subclass deficiency  We will see you back in about 6-8 weeks    Current Outpatient Medications:  .  albuterol (PROVENTIL HFA) 108 (90 Base) MCG/ACT inhaler, Inhale 2 puffs into the lungs every 6 (six) hours as needed for wheezing or shortness of breath., Disp: , Rfl:  .  albuterol (PROVENTIL) (2.5 MG/3ML) 0.083% nebulizer solution, Take 3 mLs (2.5 mg total) by nebulization 2 (two) times daily., Disp: 120 mL, Rfl: 11 .  benzonatate (TESSALON) 200 MG capsule, TAKE ONE CAPSULE 3 TIMES A DAY PRN, Disp: , Rfl: 3 .  cholecalciferol (VITAMIN D) 1000 units tablet, Take 1,000 Units by mouth daily., Disp: , Rfl:  .  fexofenadine (ALLEGRA) 180 MG tablet, Take 180 mg by mouth daily., Disp: , Rfl:  .  ketotifen (ZADITOR) 0.025 % ophthalmic solution, 2 drops 2 (two) times daily., Disp: , Rfl:  .  lamoTRIgine (LAMICTAL) 150 MG tablet, Take 150 mg by mouth daily., Disp: , Rfl:  .  lithium 300 MG tablet, Take 300 mg by mouth daily. , Disp: , Rfl: 4 .  Mometasone Furoate (ASMANEX HFA) 100 MCG/ACT AERO, Inhale 2 puffs into the lungs  2 (two) times daily., Disp: 1 Inhaler, Rfl: 5 .  Omega-3 1000 MG CAPS, Take 1 capsule by mouth daily., Disp: , Rfl:  .  omeprazole (PRILOSEC) 40 MG capsule, TAKE 1 CAPSULE (40 MG TOTAL) BY MOUTH 2 TIMES DAILY  BEFORE MEALS., Disp: , Rfl: 5 .  sodium chloride HYPERTONIC 3 % nebulizer solution, Take by nebulization 2 (two) times daily., Disp: 300 mL, Rfl: 11

## 2018-02-17 ENCOUNTER — Telehealth: Payer: Self-pay | Admitting: Pulmonary Disease

## 2018-02-17 NOTE — Telephone Encounter (Signed)
Spoke with patient. Advised her that [facility] for future [facility]. I will fill out the form and fax it to the [facility]. She verbalized understanding.   She also had a billing question. She stated that she has been received notices about a bill trying to be processed via [facility]. She stated that she has [employer] and all bills need to be sent to them. Advised her that I would send a message to Marisue IvanLiz to see if she could help out.   Marisue IvanLiz, please advise if you know how to [facility] sent to the correct place ([facility]). Thanks!

## 2018-02-22 NOTE — Telephone Encounter (Signed)
Called and spoke with pt regarding billing concerns. She stated that she has been received notices about a bill trying to be processed via [facility].  She stated that she has [employer] and all bills need to be sent to them.  Advised her that I would send a message to Marisue IvanLiz to see if she could help out.  Advised pt that Marisue IvanLiz is out of the office until 03/07/2018; and will look into this concern at that time.  Marisue IvanLiz, please advise if you know how to get [facility] sent to the correct place ([facility]). Thanks!

## 2018-03-08 ENCOUNTER — Ambulatory Visit: Payer: Self-pay | Admitting: Pulmonary Disease

## 2018-03-11 NOTE — Telephone Encounter (Signed)
Marisue IvanLiz could you please help with this? Thanks!

## 2018-03-14 ENCOUNTER — Ambulatory Visit: Payer: Self-pay | Admitting: Pulmonary Disease

## 2018-03-14 NOTE — Progress Notes (Deleted)
     Subjective:   PATIENT ID: Bridget Long GENDER: female DOB: [birth year], MRN: [id number]  Synopsis: Referred in Feb 2019 for Cough: found to have bronchiectasis  HPI  No chief complaint on file.  ***   Past Medical History:  Diagnosis Date  . Bipolar 1 disorder (HCC)   . Depression   . Diverticulitis      Review of Systems  Constitutional: Negative for fever and weight loss.  HENT: Positive for congestion. Negative for ear pain, nosebleeds and sore throat.   Eyes: Negative for redness.  Respiratory: Positive for cough and shortness of breath. Negative for wheezing.   Cardiovascular: Negative for palpitations, leg swelling and PND.  Gastrointestinal: Negative for nausea and vomiting.  Genitourinary: Negative for dysuria.  Skin: Negative for rash.  Neurological: Negative for headaches.  Endo/Heme/Allergies: Does not bruise/bleed easily.  Psychiatric/Behavioral: Negative for depression. The patient is not nervous/anxious.       Objective:  Physical Exam   There were no vitals filed for this visit.  ***  CBC No results found for: WBC, RBC, HGB, HCT, PLT, MCV, MCH, MCHC, RDW, LYMPHSABS, MONOABS, EOSABS, BASOSABS   Chest imaging: 2014 CT chest multiple small pulmonary nodules, mild lymphadenopathy, right upper lobe bronchiectasis and tree-in-bud abnormalities noted  PFT: 2013 pulmonary [facility] ratio normal, FEV1 2.29 L 97% predicted, FVC 2.96 L 95% predicted, total lung capacity 4.31 L 91% predicted, DLCO 23.9 118% predicted  Labs:  Path:  Echo:  Heart Catheterization:  Other imaging: CT sinuses 2014 showed chronic sinusitis       Assessment & Plan:   No diagnosis found.  Discussion: ***   Current Outpatient Medications:  .  albuterol (PROVENTIL HFA) 108 (90 Base) MCG/ACT inhaler, Inhale 2 puffs into the lungs every 6 (six) hours as needed for wheezing or shortness of breath., Disp: , Rfl:  .  albuterol (PROVENTIL) (2.5  MG/3ML) 0.083% nebulizer solution, Take 3 mLs (2.5 mg total) by nebulization 2 (two) times daily., Disp: 120 mL, Rfl: 11 .  benzonatate (TESSALON) 200 MG capsule, TAKE ONE CAPSULE 3 TIMES A DAY PRN, Disp: , Rfl: 3 .  cholecalciferol (VITAMIN D) 1000 units tablet, Take 1,000 Units by mouth daily., Disp: , Rfl:  .  fexofenadine (ALLEGRA) 180 MG tablet, Take 180 mg by mouth daily., Disp: , Rfl:  .  ketotifen (ZADITOR) 0.025 % ophthalmic solution, 2 drops 2 (two) times daily., Disp: , Rfl:  .  lamoTRIgine (LAMICTAL) 150 MG tablet, Take 150 mg by mouth daily., Disp: , Rfl:  .  lithium 300 MG tablet, Take 300 mg by mouth daily. , Disp: , Rfl: 4 .  Mometasone Furoate (ASMANEX HFA) 100 MCG/ACT AERO, Inhale 2 puffs into the lungs 2 (two) times daily., Disp: 1 Inhaler, Rfl: 5 .  Omega-3 1000 MG CAPS, Take 1 capsule by mouth daily., Disp: , Rfl:  .  omeprazole (PRILOSEC) 40 MG capsule, TAKE 1 CAPSULE (40 MG TOTAL) BY MOUTH 2 TIMES DAILY BEFORE MEALS., Disp: , Rfl: 5 .  sodium chloride HYPERTONIC 3 % nebulizer solution, Take by nebulization 2 (two) times daily., Disp: 300 mL, Rfl: 11

## 2018-03-16 NOTE — Telephone Encounter (Signed)
Marisue IvanLiz have you had a chance to take a look at this?

## 2018-03-21 NOTE — Telephone Encounter (Signed)
Called and spoke with the patient regarding her billing concerns. Pt advised that she thinks that it has been corrected, as she has not rec'd anymore from that company. Pt advised that she will f/u more on her end, nothing further needed from our office. Nothing further needed.

## 2018-04-22 ENCOUNTER — Ambulatory Visit (INDEPENDENT_AMBULATORY_CARE_PROVIDER_SITE_OTHER): Payer: No Typology Code available for payment source | Admitting: Pulmonary Disease

## 2018-04-22 ENCOUNTER — Encounter: Payer: Self-pay | Admitting: Pulmonary Disease

## 2018-04-22 VITALS — BP 108/60 | HR 84 | Ht 61.81 in | Wt 152.0 lb

## 2018-04-22 DIAGNOSIS — J479 Bronchiectasis, uncomplicated: Secondary | ICD-10-CM | POA: Diagnosis not present

## 2018-04-22 NOTE — Progress Notes (Signed)
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 Follow-up   [name] says she has been doing well since the last visit.  She has not had an episode of bronchitis or pneumonia.  She says that she does not struggle with wheezing or dyspnea on a day-to-day basis.  She says that she feels like the hypertonic saline nebulizer and the albuterol have really made a difference in terms of helping her control the cough.  She says that she wants to get a flu shot this year through the [place].  Past Medical History:  Diagnosis Date  . Bipolar 1 disorder (HCC)   . Depression   . Diverticulitis      Family History  Problem Relation Age of Onset  . Pulmonary embolism Father   . Alzheimer's disease Father   . Healthy Sister   . Allergic rhinitis Neg Hx   . Angioedema Neg Hx   . Asthma Neg Hx   . Eczema Neg Hx   . Immunodeficiency Neg Hx   . Urticaria Neg Hx      Social History   Socioeconomic History  . Marital status: Married    Spouse name: Not on file  . Number of children: 2  . Years of education: BS  . Highest education level: Not on file  Occupational History  . Occupation: [employer]  Social Needs  . Financial resource strain: Not on file  . Food insecurity:    Worry: Not on file    Inability: Not on file  . Transportation needs:    Medical: Not on file    Non-medical: Not on file  Tobacco Use  . Smoking status: Never Smoker  . Smokeless tobacco: Never Used  Substance and Sexual Activity  . Alcohol use: Yes    Alcohol/week: 0.0 standard drinks    Comment: 2 glasses wine 3 times a week  . Drug use: No  . Sexual activity: Not on file  Lifestyle  . Physical activity:    Days per week: Not on file    Minutes per session: Not on file  . Stress: Not on file    Relationships  . Social connections:    Talks on phone: Not on file    Gets together: Not on file    Attends religious service: Not on file    Active member of club or organization: Not on file    Attends meetings of clubs or organizations: Not on file    Relationship status: Not on file  . Intimate partner violence:    Fear of current or ex partner: Not on file    Emotionally abused: Not on file    Physically abused: Not on file    Forced sexual activity: Not on file  Other Topics Concern  . Not on file  Social History Narrative   Patient is right handed   Patient drinks 3 cups caffeine daily     Allergies  Allergen Reactions  . Sulfa Antibiotics Rash     Outpatient Medications Prior to Visit  Medication Sig Dispense Refill  . albuterol (PROVENTIL HFA) 108 (90 Base) MCG/ACT inhaler Inhale 2 puffs into the lungs every 6 (six) hours as needed for wheezing or shortness of breath.    Marland Kitchen albuterol (PROVENTIL) (2.5  MG/3ML) 0.083% nebulizer solution Take 3 mLs (2.5 mg total) by nebulization 2 (two) times daily. 120 mL 11  . benzonatate (TESSALON) 200 MG capsule TAKE ONE CAPSULE 3 TIMES A DAY PRN  3  . cholecalciferol (VITAMIN D) 1000 units tablet Take 1,000 Units by mouth daily.    . fexofenadine (ALLEGRA) 180 MG tablet Take 180 mg by mouth daily.    Marland Kitchen ketotifen (ZADITOR) 0.025 % ophthalmic solution 2 drops 2 (two) times daily.    Marland Kitchen lamoTRIgine (LAMICTAL) 150 MG tablet Take 150 mg by mouth daily.    Marland Kitchen lithium 300 MG tablet Take 300 mg by mouth daily.   4  . Omega-3 1000 MG CAPS Take 1 capsule by mouth daily.    . sodium chloride HYPERTONIC 3 % nebulizer solution Take by nebulization 2 (two) times daily. 300 mL 11  . Mometasone Furoate (ASMANEX HFA) 100 MCG/ACT AERO Inhale 2 puffs into the lungs 2 (two) times daily. 1 Inhaler 5  . omeprazole (PRILOSEC) 40 MG capsule TAKE 1 CAPSULE (40 MG TOTAL) BY MOUTH 2 TIMES DAILY BEFORE MEALS.  5   No facility-administered medications prior to  visit.     Review of Systems  Constitutional: Negative for fever and weight loss.  HENT: Positive for congestion. Negative for ear pain, nosebleeds and sore throat.   Eyes: Negative for redness.  Respiratory: Positive for cough and shortness of breath. Negative for wheezing.   Cardiovascular: Negative for palpitations, leg swelling and PND.  Gastrointestinal: Negative for nausea and vomiting.  Genitourinary: Negative for dysuria.  Skin: Negative for rash.  Neurological: Negative for headaches.  Endo/Heme/Allergies: Does not bruise/bleed easily.  Psychiatric/Behavioral: Negative for depression. The patient is not nervous/anxious.       Objective:  Physical Exam   Vitals:   04/22/18 1447  BP: 108/60  Pulse: 84  SpO2: 97%  Weight: 152 lb (68.9 kg)  Height: 5' 1.81" (1.57 m)    Gen: well appearing HENT: OP clear, TM's clear, neck supple PULM: CTA B, normal percussion CV: RRR, no mgr, trace edema GI: BS+, soft, nontender Derm: no cyanosis or rash Psyche: normal mood and affect    CBC No results found for: WBC, RBC, HGB, HCT, PLT, MCV, MCH, MCHC, RDW, LYMPHSABS, MONOABS, EOSABS, BASOSABS   Chest imaging: 2014 CT chest multiple small pulmonary nodules, mild lymphadenopathy, right upper lobe bronchiectasis and tree-in-bud abnormalities noted 03/2018 HRCT> images personally reviewed, some tree-in-bud abnormalities in the RUL, bronchiectasis changes bilaterally  PFT: 2013 pulmonary function test from the [place] ratio normal, FEV1 2.29 L 97% predicted, FVC 2.96 L 95% predicted, total lung capacity 4.31 L 91% predicted, DLCO 23.9 118% predicted  Labs: 03/2018 Serum IgG, IgG subclass 1 and 2 both low  Path:  Echo:  Heart Catheterization:  Other imaging: CT sinuses 2014 showed chronic sinusitis       Assessment & Plan:   No diagnosis found.  Discussion: [name] has been doing well since the last visit.  This has been a stable interval for her.  She is doing well  with hypertonic saline and albuterol.  Interestingly, we found that her's total serum IgG and 2 IgG subclasses were low.  This can increase her risk of infection from encapsulated organisms and respiratory illnesses.  It also is likely the reason why she has bronchiectasis.  We talked about the importance of adherence to a mucociliary clearance regimen.  This has already helped her from a symptom standpoint but should also help prevent infection.  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  I recommend Dr. Laurette SchimkeEric Kozlow  We will see you back in 6 months or sooner if needed    Current Outpatient Medications:  .  albuterol (PROVENTIL HFA) 108 (90 Base) MCG/ACT inhaler, Inhale 2 puffs into the lungs every 6 (six) hours as needed for wheezing or shortness of breath., Disp: , Rfl:  .  albuterol (PROVENTIL) (2.5 MG/3ML) 0.083% nebulizer solution, Take 3 mLs (2.5 mg total) by nebulization 2 (two) times daily., Disp: 120 mL, Rfl: 11 .  benzonatate (TESSALON) 200 MG capsule, TAKE ONE CAPSULE 3 TIMES A DAY PRN, Disp: , Rfl: 3 .  cholecalciferol (VITAMIN D) 1000 units tablet, Take 1,000 Units by mouth daily., Disp: , Rfl:  .  fexofenadine (ALLEGRA) 180 MG tablet, Take 180 mg by mouth daily., Disp: , Rfl:  .  ketotifen (ZADITOR) 0.025 % ophthalmic solution, 2 drops 2 (two) times daily., Disp: , Rfl:  .  lamoTRIgine (LAMICTAL) 150 MG tablet, Take 150 mg by mouth daily., Disp: , Rfl:  .  lithium 300 MG tablet, Take 300 mg by mouth daily. , Disp: , Rfl: 4 .  Omega-3 1000 MG CAPS, Take 1 capsule by mouth daily., Disp: , Rfl:  .  sodium chloride HYPERTONIC 3 % nebulizer solution, Take by  nebulization 2 (two) times daily., Disp: 300 mL, Rfl: 11 .  Mometasone Furoate (ASMANEX HFA) 100 MCG/ACT AERO, Inhale 2 puffs into the lungs 2 (two) times daily., Disp: 1 Inhaler, Rfl: 5

## 2018-04-22 NOTE — Patient Instructions (Signed)
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  I recommend Dr. Laurette SchimkeEric Kozlow  We will see you back in 6 months or sooner if needed

## 2018-05-20 ENCOUNTER — Telehealth: Payer: Self-pay | Admitting: Pulmonary Disease

## 2018-05-20 NOTE — Telephone Encounter (Signed)
Office notes faxed. Left message with Bridgette making her aware if she needed anything else to let us know. Nothing further needed at this time.

## 2018-12-30 IMAGING — CT CT CHEST HIGH RESOLUTION W/O CM
2 of 5 series · 15 of 36 positions shown, 18 images · non-contrast
Comparison: 09/16/2015 chest radiograph.

CLINICAL DATA: Chronic productive cough.  Nonsmoker.

EXAM:
CT CHEST WITHOUT CONTRAST
TECHNIQUE: Multidetector CT imaging of the chest was performed following the
standard protocol without intravenous contrast. High resolution
imaging of the lungs, as well as inspiratory and expiratory imaging,
was performed.

[Series 2: high resolution · axial · 0.63mm/px · z∈[-299,-43]mm · 12 of 142 slices shown, 15 images]
[im 7/142  mediastinal]
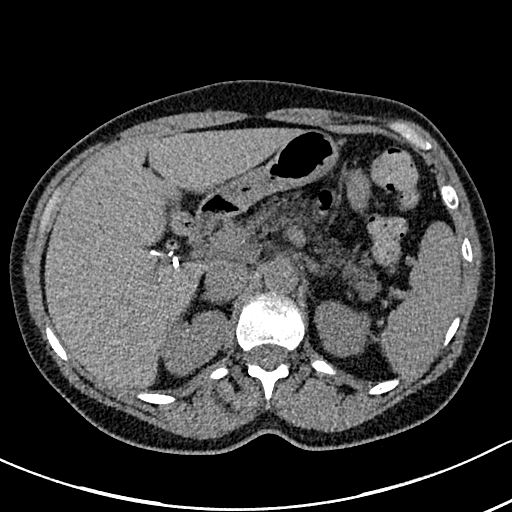
[im 7/142  lung]
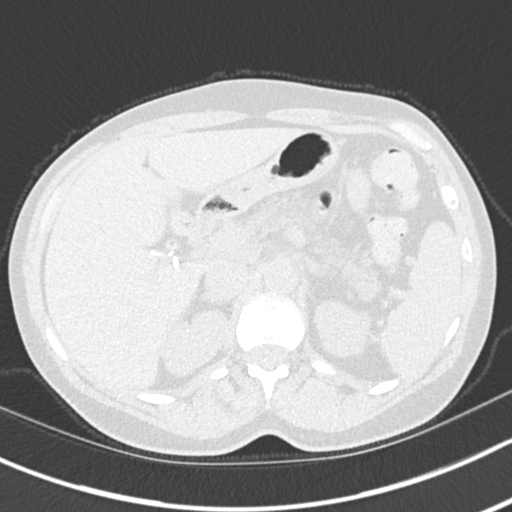
[im 20/142  lung]
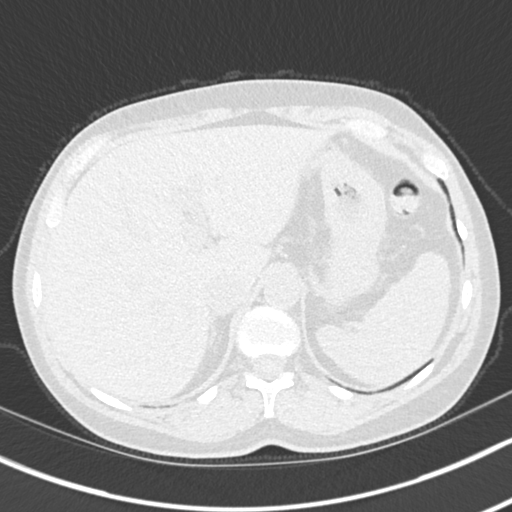
[im 33/142  lung]
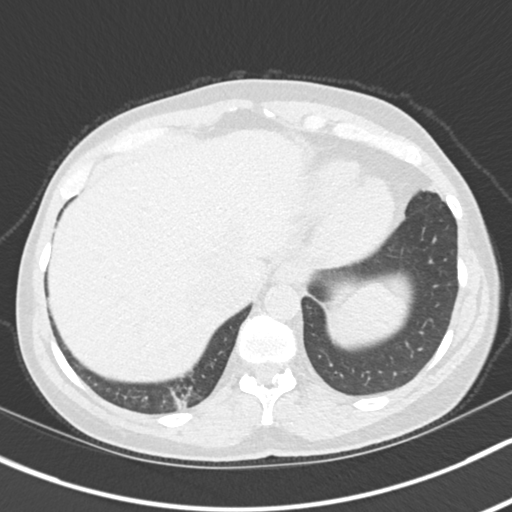
[im 45/142  lung]
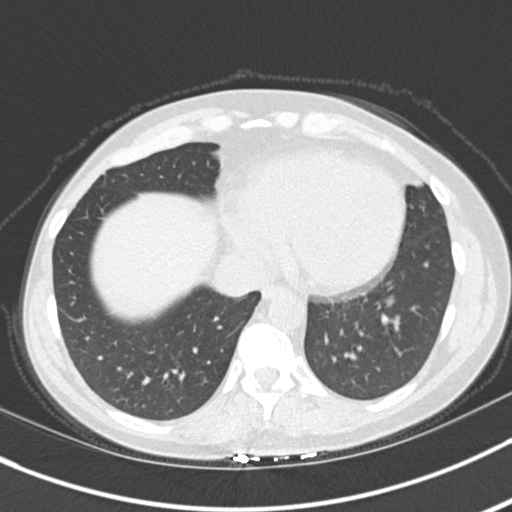
[im 52/142  mediastinal]
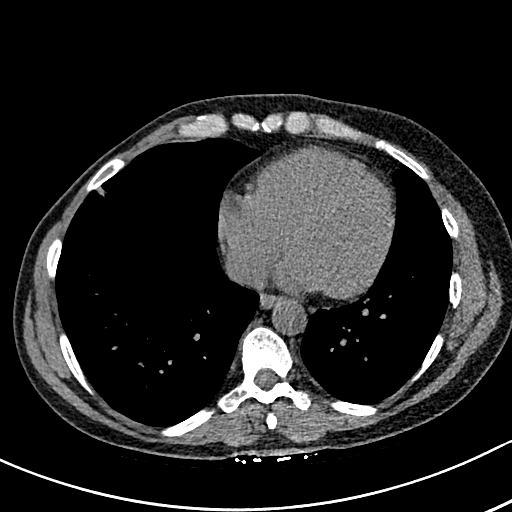
[im 52/142  lung]
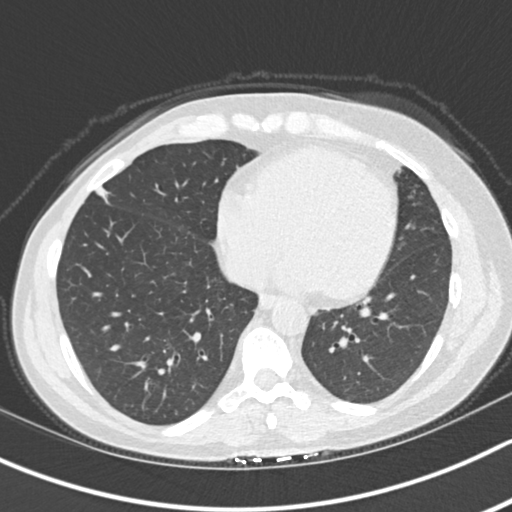
[im 65/142  lung]
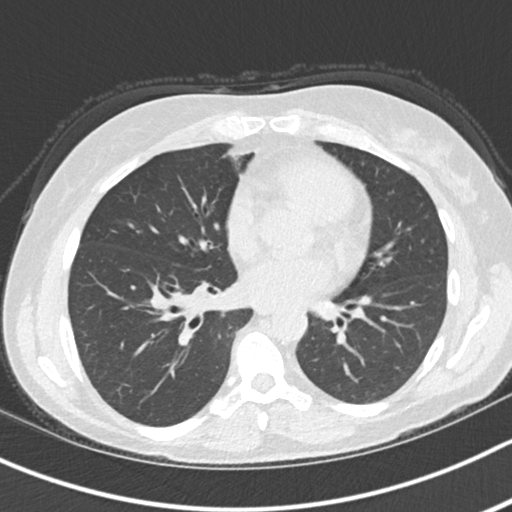
[im 77/142  lung]
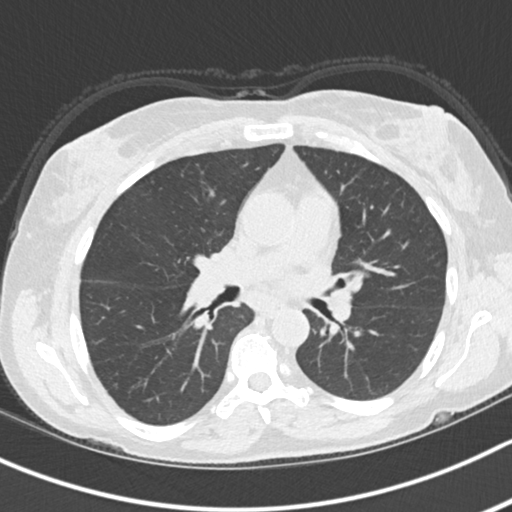
[im 90/142  lung]
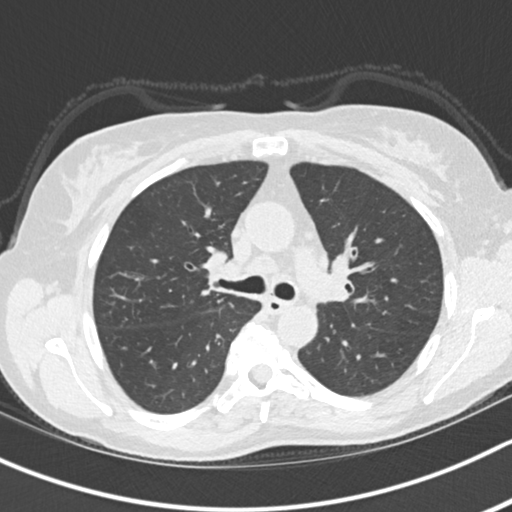
[im 97/142  mediastinal]
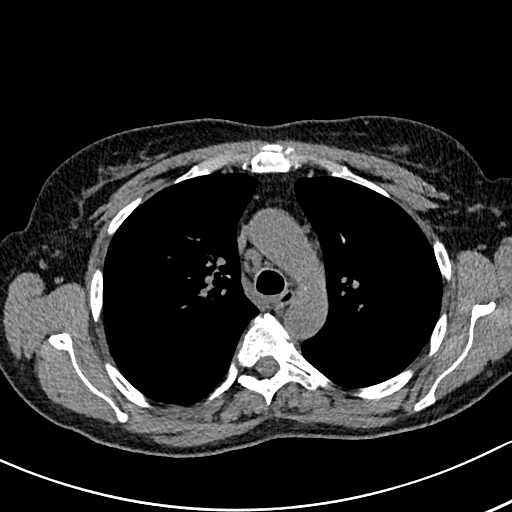
[im 97/142  lung]
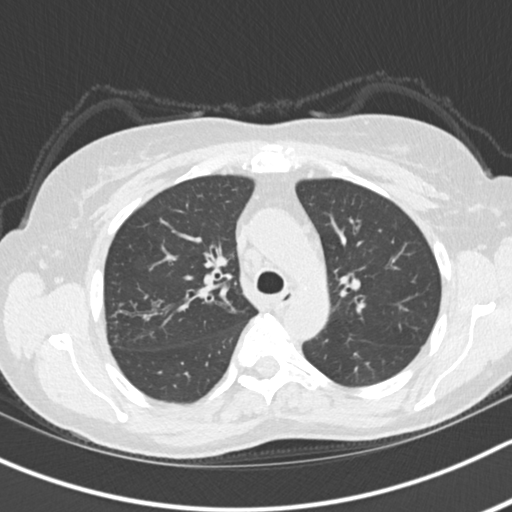
[im 109/142  lung]
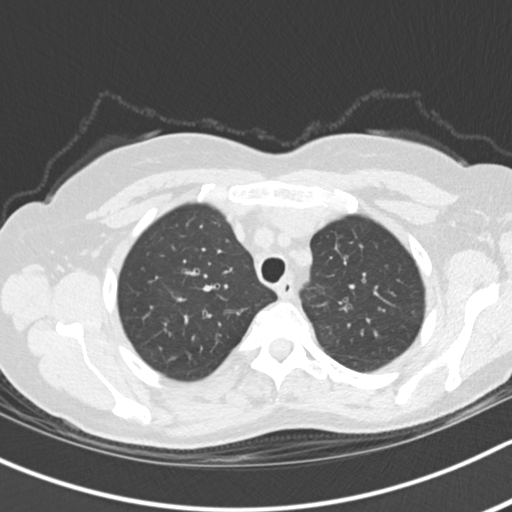
[im 122/142  lung]
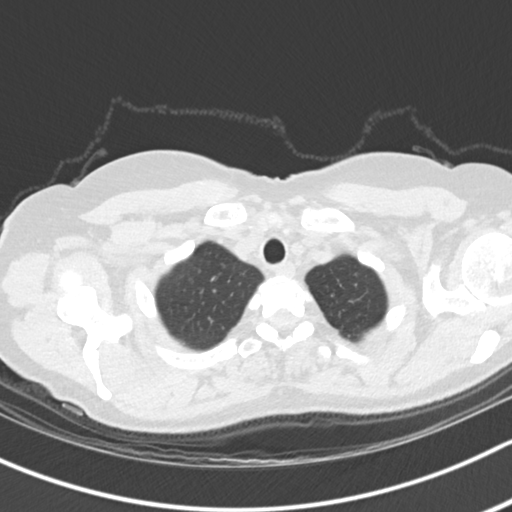
[im 135/142  lung]
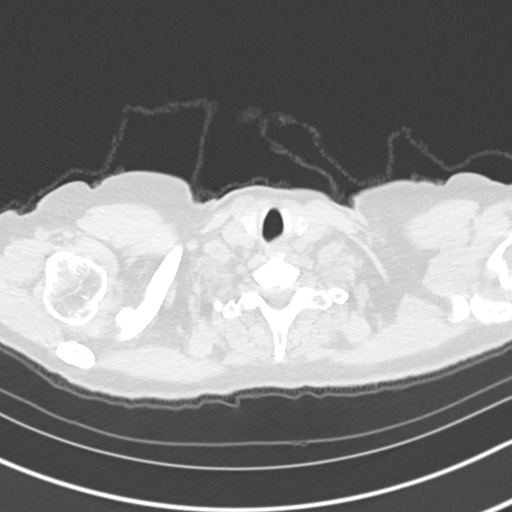

[Series 8: coronal · coronal · 0.59mm/px · 3 of 111 slices shown]
[im 23/111  lung]
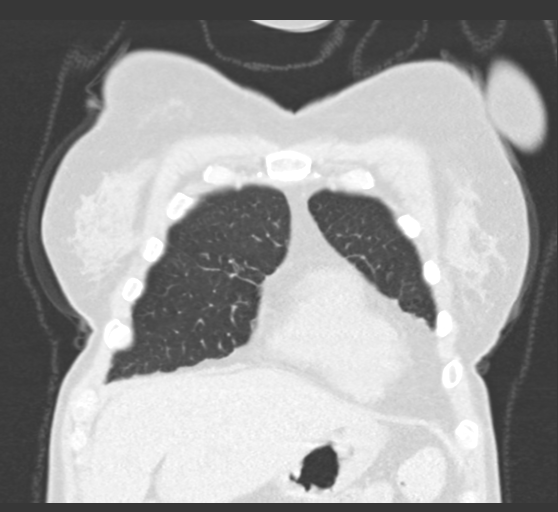
[im 45/111  lung]
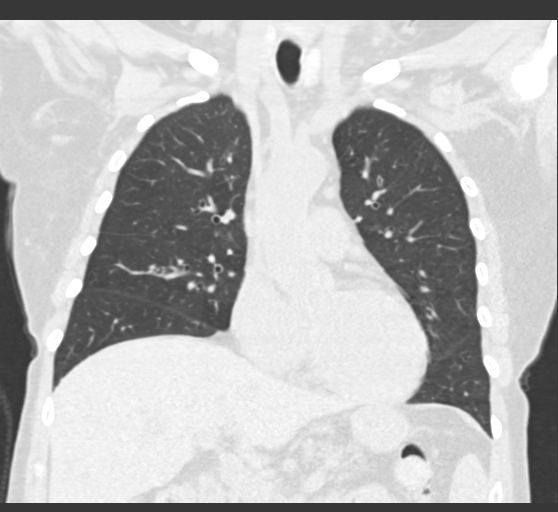
[im 67/111  lung]
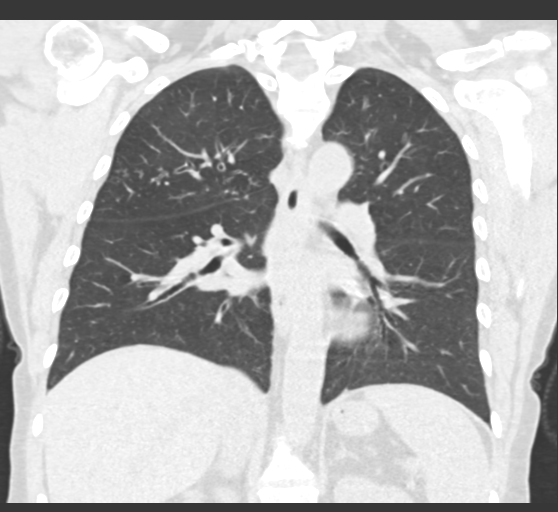

[15 of 36 positions shown; findings below may reference images not displayed]

FINDINGS: Cardiovascular: Normal heart size. No significant pericardial
fluid/thickening. Great vessels are normal in course and caliber.

Mediastinum/Nodes: Sub 5 mm hypodense superior left thyroid lobe
nodule. Unremarkable esophagus. No axillary adenopathy. Mild right
paratracheal adenopathy up to 1.1 cm (series 2/image 54). Mildly
enlarged 1.1 cm subcarinal node (series 2/image 66). No discrete
hilar adenopathy on these noncontrast images.

Lungs/Pleura: No pneumothorax. No pleural effusion. There is mild
cylindrical bronchiectasis throughout both lungs, most prominent in
the right middle lobe and upper lobes, with associated diffuse
bronchial wall thickening. There are mild patchy tree-in-bud
opacities at the areas of bronchiectasis, most prominent in the
right upper lobe. Small bandlike opacities are noted in the
subpleural lungs at the areas of bronchiectasis in the right middle
lobe and inferior segment lingula, compatible with mild
postinfectious scarring. Marked patchy air trapping throughout both
lungs on the expiration sequence. Small patchy triangular focus of
subpleural consolidation in dependent basilar right lower lobe
(series 3/image 115). No frank honeycombing.

Upper abdomen: Cholecystectomy.

Musculoskeletal: No aggressive appearing focal osseous lesions. Mild
thoracic spondylosis.
IMPRESSION: 1. Mild cylindrical bronchiectasis throughout both lungs, most
prominent in the right middle lobe and upper lobes, with associated
diffuse bronchial wall thickening, mild patchy tree-in-bud opacities
and small subpleural foci of postinfectious scarring. These findings
are most compatible with a chronic infectious bronchiolitis such as
due to atypical mycobacterial infection (DONE).
2. Small triangular patchy focus of subpleural consolidation in the
dependent basilar right lower lobe, probably infectious, potentially
evolving postinfectious scarring. Consider attention on a follow-up
chest CT in 6 months.
3. Prominent air trapping throughout both lungs, indicative of small
airways disease.
4. Mild nonspecific mediastinal lymphadenopathy, probably reactive.

## 2019-10-03 DIAGNOSIS — Z0289 Encounter for other administrative examinations: Secondary | ICD-10-CM

## 2021-05-27 ENCOUNTER — Ambulatory Visit (INDEPENDENT_AMBULATORY_CARE_PROVIDER_SITE_OTHER): Payer: No Typology Code available for payment source | Admitting: Pulmonary Disease

## 2021-05-27 ENCOUNTER — Other Ambulatory Visit: Payer: Self-pay

## 2021-05-27 ENCOUNTER — Encounter: Payer: Self-pay | Admitting: Pulmonary Disease

## 2021-05-27 VITALS — BP 121/70 | HR 76 | Ht 63.0 in | Wt 140.2 lb

## 2021-05-27 DIAGNOSIS — J479 Bronchiectasis, uncomplicated: Secondary | ICD-10-CM

## 2021-05-27 DIAGNOSIS — D809 Immunodeficiency with predominantly antibody defects, unspecified: Secondary | ICD-10-CM

## 2021-05-27 NOTE — Progress Notes (Addendum)
Synopsis: Referred in September 2022 for cough/shortness of breath by Marcelyn Bruins, MD. Former patient of Dr. Kendrick Fries.   Subjective:   PATIENT ID: Bridget Long GENDER: female DOB: 02-25-1956, MRN: [id number]   HPI  Chief Complaint  Patient presents with   Consult    Referred by Dr. Shelle Iron for bronchiectasis. States her bronchiectasis has been stable for the past 2 years. Her current pulmonologist at the [place] is retiring and she wishes to reestablish with our office.     Janani Chamber is a 65 year old woman, never smoker who is referred to pulmonary clinic for cough and shortness of breath. She was previously followed by Dr. Kendrick Fries for bronchiectasis, last seen in 2019.   She has been followed by Dr. Kearney Hard for her bronchiectasis at the [facility] clinic but he is retiring and she has been referred back to our clinic for further pulmonary care.  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  She recently started IVIG infusions since December 2021.  She continues to have sinus congestion and postnasal drainage.  She reports an intermittent cough with sputum production that is likely secondary to her postnasal drainage.  She will use a flutter valve regularly to help cough up mucus.  She is not using albuterol nebulizer treatments.  She denies issues with wheezing.  She will use Flonase if she notices an increase in congestion/drainage.  She has not had a sense of smell since 2013.  She previously lived in [place] prior to moving to [place] and had a sense of smell prior to her move.  She has follow-up with ENT in the near future. She does have history of sinus polyps s/p sinus surgery and removal. ANCA testing is negative from 2016.   She is a never smoker.  She was exposed to secondhand smoke as a child.  She retired from [employer] in 2017.  She was in [employer] and part of the [employer].  Notes reviewed from Dr. Shelle Iron,  she was last seen 04/01/21. He reports a CT chest with increased density in the RUL along with increased tree-in-bud pattern of the right lung. She has grown MSSA in her sputum in 2021 with AFB negative at that time. Eosinophil count was 340 12/2019.   Past Medical History:  Diagnosis Date   Bipolar 1 disorder (HCC)    Depression    Diverticulitis      Family History  Problem Relation Age of Onset   Pulmonary embolism Father    Alzheimer's disease Father    Healthy Sister    Allergic rhinitis Neg Hx    Angioedema Neg Hx    Asthma Neg Hx    Eczema Neg Hx    Immunodeficiency Neg Hx    Urticaria Neg Hx      Social History   Socioeconomic History   Marital status: Married    Spouse name: Not on file   Number of children: 2   Years of education: BS   Highest education level: Not on file  Occupational History   Occupation: [employer]  Tobacco Use   Smoking status: Never   Smokeless tobacco: Never  Vaping Use   Vaping Use: Never used  Substance and Sexual Activity   Alcohol use: Yes    Alcohol/week: 0.0 standard drinks    Comment: 2 glasses wine 3 times a week   Drug use: No   Sexual activity: Not on file  Other Topics  Concern   Not on file  Social History Narrative   Patient is right handed   Patient drinks 3 cups caffeine daily   Social Determinants of Health   Financial Resource Strain: Not on file  Food Insecurity: Not on file  Transportation Needs: Not on file  Physical Activity: Not on file  Stress: Not on file  Social Connections: Not on file  Intimate Partner Violence: Not on file     Allergies  Allergen Reactions   Sulfa Antibiotics Rash     Outpatient Medications Prior to Visit  Medication Sig Dispense Refill   acetaminophen (TYLENOL) 500 MG tablet TAKE ONE TABLET BY MOUTH EVERY 28 DAYS FOR IVIG PRE-TREATMENT ONLY     albuterol (PROVENTIL) (2.5 MG/3ML) 0.083% nebulizer solution Take 3 mLs (2.5 mg total) by nebulization 2 (two) times daily. 120 mL 11    albuterol (VENTOLIN HFA) 108 (90 Base) MCG/ACT inhaler Inhale 2 puffs into the lungs every 6 (six) hours as needed for wheezing or shortness of breath.     cholecalciferol (VITAMIN D) 1000 units tablet Take 1,000 Units by mouth daily.     diphenhydrAMINE (BENADRYL) 50 MG/ML injection INJECT 25 MG (0.5 ML) INTRAVENOUSLY EVERY 28 DAYS FOR IVIG PRE-TREATMENT ONLY     DULoxetine (CYMBALTA) 60 MG capsule Take 60 mg by mouth daily.     fexofenadine (ALLEGRA) 180 MG tablet Take 180 mg by mouth daily.     ketotifen (ZADITOR) 0.025 % ophthalmic solution 2 drops 2 (two) times daily.     lamoTRIgine (LAMICTAL) 200 MG tablet Take 200 mg by mouth 2 (two) times daily.     lurasidone (LATUDA) 40 MG TABS tablet Take 40 mg by mouth daily.     sodium chloride HYPERTONIC 3 % nebulizer solution Take by nebulization 2 (two) times daily. 300 mL 11   benzonatate (TESSALON) 200 MG capsule TAKE ONE CAPSULE 3 TIMES A DAY PRN  3   lamoTRIgine (LAMICTAL) 150 MG tablet Take 150 mg by mouth daily.     lithium 300 MG tablet Take 300 mg by mouth daily.   4   Mometasone Furoate (ASMANEX HFA) 100 MCG/ACT AERO Inhale 2 puffs into the lungs 2 (two) times daily. 1 Inhaler 5   Omega-3 1000 MG CAPS Take 1 capsule by mouth daily.     No facility-administered medications prior to visit.    Review of Systems  Constitutional:  Negative for chills, fever, malaise/fatigue and weight loss.  HENT:  Positive for congestion. Negative for sinus pain and sore throat.   Eyes: Negative.   Respiratory:  Positive for cough and sputum production. Negative for hemoptysis, shortness of breath and wheezing.   Cardiovascular:  Negative for chest pain, palpitations, orthopnea, claudication and leg swelling.  Gastrointestinal:  Negative for abdominal pain, heartburn, nausea and vomiting.  Genitourinary: Negative.   Musculoskeletal:  Negative for joint pain and myalgias.  Skin:  Negative for rash.  Neurological:  Negative for weakness.   Endo/Heme/Allergies: Negative.   Psychiatric/Behavioral: Negative.     Objective:   Vitals:   05/27/21 1540  BP: 121/70  Pulse: 76  SpO2: 97%  Weight: 140 lb 3.2 oz (63.6 kg)  Height: 5\' 3"  (1.6 m)   Physical Exam Constitutional:      General: She is not in acute distress.    Appearance: She is not ill-appearing.  HENT:     Head: Normocephalic and atraumatic.     Nose: Rhinorrhea present.     Mouth/Throat:  Mouth: Mucous membranes are moist.     Pharynx: Oropharynx is clear. No oropharyngeal exudate or posterior oropharyngeal erythema.  Eyes:     General: No scleral icterus.    Conjunctiva/sclera: Conjunctivae normal.     Pupils: Pupils are equal, round, and reactive to light.  Cardiovascular:     Rate and Rhythm: Normal rate and regular rhythm.     Pulses: Normal pulses.     Heart sounds: Normal heart sounds. No murmur heard. Pulmonary:     Effort: Pulmonary effort is normal.     Breath sounds: Normal breath sounds. No wheezing, rhonchi or rales.  Abdominal:     General: Bowel sounds are normal.     Palpations: Abdomen is soft.  Musculoskeletal:     Right lower leg: No edema.     Left lower leg: No edema.  Lymphadenopathy:     Cervical: No cervical adenopathy.  Skin:    General: Skin is warm and dry.  Neurological:     General: No focal deficit present.     Mental Status: She is alert.  Psychiatric:        Mood and Affect: Mood normal.        Behavior: Behavior normal.        Thought Content: Thought content normal.        Judgment: Judgment normal.   CBC No results found for: WBC, RBC, HGB, HCT, PLT, MCV, MCH, MCHC, RDW, LYMPHSABS, MONOABS, EOSABS, BASOSABS  Chest imaging: HRCT Chest 10/2017 1. Mild cylindrical bronchiectasis throughout both lungs, most prominent in the right middle lobe and upper lobes, with associated diffuse bronchial wall thickening, mild patchy tree-in-bud opacities and small subpleural foci of postinfectious scarring. 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 2. Small triangular patchy focus of subpleural consolidation in the dependent basilar right lower lobe, probably infectious, potentially evolving postinfectious scarring. Consider attention on a follow-up chest CT in 6 months. 3. Prominent air trapping throughout both lungs, indicative of small airways disease. 4. Mild nonspecific mediastinal lymphadenopathy, probably reactive.  PFT: No flowsheet data found.  Labs: 03/2018 Serum IgG, IgG subclass 1 and 2 both low ANCA negative in 2016  Path:  Echo:  Heart Catheterization:  Other Imaging CT sinuses 2014 showed chronic sinusitis     Assessment & Plan:   Bronchiectasis without complication (HCC)  Immunoglobulin deficiency (HCC)  Discussion: Jennie Hannay is a 65 year old woman, never smoker who is referred to pulmonary clinic for cough and shortness of breath. She was previously followed by Dr. Kendrick Fries for bronchiectasis, last seen in 2019.   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  Her bronchiectasis appears to be under good control at this time.  She has a nebulizer machine and solution at home as well as a flutter valve and is well versed in picking up bronchial hygiene if she notices changes in her sputum production. She reports having CT scans performed at the [place] this year and we will place a request for CD copies. We will determine CT follow up plan once her imaging from the [place] has been reviewed.  She has significant sinus congestion and postnasal drainage which she manages with as needed Flonase.  She reports having an appointment with ENT in the near future.  We discussed the potential for biologic therapy given her history of sinus polyps along with ongoing congestion, postnasal drainage  and loss of her sense of smell.  We  will await further evaluation and recommendations from the ENT physician in regards to her sinuses.  Follow up in 6 months.  Melody Comas, MD [facility] Office: [phone]   Current Outpatient Medications:    acetaminophen (TYLENOL) 500 MG tablet, TAKE ONE TABLET BY MOUTH EVERY 28 DAYS FOR IVIG PRE-TREATMENT ONLY, Disp: , Rfl:    albuterol (PROVENTIL) (2.5 MG/3ML) 0.083% nebulizer solution, Take 3 mLs (2.5 mg total) by nebulization 2 (two) times daily., Disp: 120 mL, Rfl: 11   albuterol (VENTOLIN HFA) 108 (90 Base) MCG/ACT inhaler, Inhale 2 puffs into the lungs every 6 (six) hours as needed for wheezing or shortness of breath., Disp: , Rfl:    cholecalciferol (VITAMIN D) 1000 units tablet, Take 1,000 Units by mouth daily., Disp: , Rfl:    diphenhydrAMINE (BENADRYL) 50 MG/ML injection, INJECT 25 MG (0.5 ML) INTRAVENOUSLY EVERY 28 DAYS FOR IVIG PRE-TREATMENT ONLY, Disp: , Rfl:    DULoxetine (CYMBALTA) 60 MG capsule, Take 60 mg by mouth daily., Disp: , Rfl:    fexofenadine (ALLEGRA) 180 MG tablet, Take 180 mg by mouth daily., Disp: , Rfl:    ketotifen (ZADITOR) 0.025 % ophthalmic solution, 2 drops 2 (two) times daily., Disp: , Rfl:    lamoTRIgine (LAMICTAL) 200 MG tablet, Take 200 mg by mouth 2 (two) times daily., Disp: , Rfl:    lurasidone (LATUDA) 40 MG TABS tablet, Take 40 mg by mouth daily., Disp: , Rfl:    sodium chloride HYPERTONIC 3 % nebulizer solution, Take by nebulization 2 (two) times daily., Disp: 300 mL, Rfl: 11

## 2021-05-27 NOTE — Patient Instructions (Addendum)
Use albuterol nebulizer treatment as needed followed by flutter valve when you notice increase chest congestion.   [facility] will [facility] so I can personally review the images.   Please call if notice worsening in cough, sputum production, fatigue/fever, or shortness of breath.  Please let us know when you see your ENT physician so I can review their recommendations.

## 2021-05-29 ENCOUNTER — Encounter: Payer: Self-pay | Admitting: Pulmonary Disease

## 2021-12-18 LAB — PULMONARY FUNCTION TEST

## 2022-01-28 ENCOUNTER — Ambulatory Visit (INDEPENDENT_AMBULATORY_CARE_PROVIDER_SITE_OTHER): Payer: No Typology Code available for payment source | Admitting: Pulmonary Disease

## 2022-01-28 ENCOUNTER — Encounter: Payer: Self-pay | Admitting: Pulmonary Disease

## 2022-01-28 ENCOUNTER — Ambulatory Visit
Admission: RE | Admit: 2022-01-28 | Discharge: 2022-01-28 | Disposition: A | Discharge status: Home / Self Care | Payer: Self-pay | Source: Ambulatory Visit | Attending: Pulmonary Disease | Admitting: Pulmonary Disease

## 2022-01-28 VITALS — BP 116/78 | HR 72 | Ht 63.0 in | Wt 142.0 lb

## 2022-01-28 DIAGNOSIS — D809 Immunodeficiency with predominantly antibody defects, unspecified: Secondary | ICD-10-CM

## 2022-01-28 DIAGNOSIS — J479 Bronchiectasis, uncomplicated: Secondary | ICD-10-CM | POA: Diagnosis not present

## 2022-01-28 MED ORDER — STIOLTO RESPIMAT 2.5-2.5 MCG/ACT IN AERS: 2.0000 | INHALATION_SPRAY | Freq: Every day | RESPIRATORY_TRACT | 5 refills | Status: AC

## 2022-01-28 NOTE — Patient Instructions (Addendum)
Your CT Chest from yesterday does show increased areas of inflammation of the right upper lung which is likely related to your recent respiratory infection.   Recommend using: - Hypertonic saline nebulizer treatments twice daily - Try stiolto inhaler 2 puffs daily - I would avoid the advair inhaler due to increased risk of infections from inhaled steroid component - Resume saline sinus rinses daily  We will check sputum cultures today  Follow up in 3 months and we will consider repeat CT chest scan at that time for monitoring.

## 2022-01-28 NOTE — Progress Notes (Unsigned)
Synopsis: Referred in September 2022 for cough/shortness of breath by Danton Sewer, MD. Former patient of Dr. Lake Bells.   Subjective:   PATIENT ID: Bridget Long GENDER: female DOB: 09-28-1955, MRN: [id number]   HPI  Chief Complaint  Patient presents with   Follow-up    9 mo f/u for bronchiectasis. States she developed a URI a few weeks ago. Increased coughing.    Bridget Long is a 66 year old woman, never smoker with history of immunoglobulin deficiency and chronic sinusitis who returns to pulmonary clinic for bronchiectasis  She was seen by ENT 07/2021 and was recommended she use saline sinus rinses daily which she is using as needed.   She reports recent respiratory infection with increased cough, mucous production from her chest and sinuses. She reports significant cough with post-tussive emesis episodes. She was never treated with antibiotics as it was felt to be viral infection from her family who she visited in [place] state. She is using flutter valve occasionally in the mornings. She is not using nebulizer treatments. She reports her cough and mucous are improving.  She had CT scan done yesterday at the [place] which shows increased RUL tree in bud involvement and mucous impaction. She was provided a prescription for advair by her immunologist at the [place]. She has not started this inhaler.   OV 05/27/21 She has been followed by Dr. Gwenette Greet for her bronchiectasis at the [facility] clinic but he is retiring and she has been referred back to our clinic for further pulmonary care.  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  She recently started IVIG infusions since December 2021.  She continues to have sinus congestion and postnasal drainage.  She reports an intermittent cough with sputum production that is likely secondary to her postnasal drainage.  She will use a flutter valve regularly to help cough up mucus.  She is not using  albuterol nebulizer treatments.  She denies issues with wheezing.  She will use Flonase if she notices an increase in congestion/drainage.  She has not had a sense of smell since 2013.  She previously lived in [place] prior to moving to [place] and had a sense of smell prior to her move.  She has follow-up with ENT in the near future. She does have history of sinus polyps s/p sinus surgery and removal. ANCA testing is negative from 2016.   She is a never smoker.  She was exposed to secondhand smoke as a child.  She retired from [employer] in 2017.  She was in [employer] and part of the [employer].  Notes reviewed from Dr. Gwenette Greet, she was last seen 04/01/21. He reports a CT chest with increased density in the RUL along with increased tree-in-bud pattern of the right lung. She has grown MSSA in her sputum in 2021 with AFB negative at that time. Eosinophil count was 340 12/2019.   Past Medical History:  Diagnosis Date   Bipolar 1 disorder ([place])    Depression    Diverticulitis      Family History  Problem Relation Age of Onset   Pulmonary embolism Father    Alzheimer's disease Father    Healthy Sister    Allergic rhinitis Neg Hx    Angioedema Neg Hx    Asthma Neg Hx    Eczema Neg Hx    Immunodeficiency Neg Hx    Urticaria Neg Hx      Social History  Socioeconomic History   Marital status: Married    Spouse name: Not on file   Number of children: 2   Years of education: BS   Highest education level: Not on file  Occupational History   Occupation: [employer]  Tobacco Use   Smoking status: Never   Smokeless tobacco: Never  Vaping Use   Vaping Use: Never used  Substance and Sexual Activity   Alcohol use: Yes    Alcohol/week: 0.0 standard drinks    Comment: 2 glasses wine 3 times a week   Drug use: No   Sexual activity: Not on file  Other Topics Concern   Not on file  Social History Narrative   Patient is right handed   Patient drinks 3 cups caffeine daily   Social  Determinants of Health   Financial Resource Strain: Not on file  Food Insecurity: Not on file  Transportation Needs: Not on file  Physical Activity: Not on file  Stress: Not on file  Social Connections: Not on file  Intimate Partner Violence: Not on file     Allergies  Allergen Reactions   Sulfa Antibiotics Rash     Outpatient Medications Prior to Visit  Medication Sig Dispense Refill   acetaminophen (TYLENOL) 500 MG tablet TAKE ONE TABLET BY MOUTH EVERY 28 DAYS FOR IVIG PRE-TREATMENT ONLY     albuterol (PROVENTIL) (2.5 MG/3ML) 0.083% nebulizer solution Take 3 mLs (2.5 mg total) by nebulization 2 (two) times daily. 120 mL 11   benzonatate (TESSALON) 100 MG capsule TAKE ONE CAPSULE BY MOUTH EVERY FOUR HOURS FOR COUGH     cholecalciferol (VITAMIN D) 1000 units tablet Take 1,000 Units by mouth daily.     diphenhydrAMINE (BENADRYL) 50 MG/ML injection INJECT 25 MG (0.5 ML) INTRAVENOUSLY EVERY 28 DAYS FOR IVIG PRE-TREATMENT ONLY     DULoxetine (CYMBALTA) 60 MG capsule Take 60 mg by mouth daily.     fexofenadine (ALLEGRA) 180 MG tablet Take 180 mg by mouth daily.     fluticasone-salmeterol (ADVAIR HFA) 115-21 MCG/ACT inhaler Inhale 2 puffs into the lungs 2 (two) times daily.     Immune Globulin, Human, 20 GM/200ML SOLN Inject 1 g/kg into the vein. Every 28 days     ipratropium (ATROVENT) 0.03 % nasal spray 2 sprays 2 (two) times daily.     ketotifen (ZADITOR) 0.025 % ophthalmic solution 2 drops 2 (two) times daily.     lamoTRIgine (LAMICTAL) 200 MG tablet Take 200 mg by mouth 2 (two) times daily.     lurasidone (LATUDA) 40 MG TABS tablet Take 40 mg by mouth daily.     Misc Natural Products (IMMUNE FORMULA PO) Take 10 % by mouth. Every 28 days     montelukast (SINGULAIR) 10 MG tablet Take 1 tablet by mouth daily.     sodium chloride HYPERTONIC 3 % nebulizer solution Take by nebulization 2 (two) times daily. 300 mL 11   albuterol (VENTOLIN HFA) 108 (90 Base) MCG/ACT inhaler Inhale 2 puffs  into the lungs every 6 (six) hours as needed for wheezing or shortness of breath.     No facility-administered medications prior to visit.    Review of Systems  Constitutional:  Negative for chills, fever, malaise/fatigue and weight loss.  HENT:  Positive for congestion. Negative for sinus pain and sore throat.   Eyes: Negative.   Respiratory:  Positive for cough and sputum production. Negative for hemoptysis, shortness of breath and wheezing.   Cardiovascular:  Negative for chest pain, palpitations, orthopnea, claudication  and leg swelling.  Gastrointestinal:  Negative for abdominal pain, heartburn, nausea and vomiting.  Genitourinary: Negative.   Musculoskeletal:  Negative for joint pain and myalgias.  Skin:  Negative for rash.  Neurological:  Negative for weakness.  Endo/Heme/Allergies: Negative.   Psychiatric/Behavioral: Negative.     Objective:   Vitals:   01/28/22 1020  BP: 116/78  Pulse: 72  SpO2: 97%  Weight: 142 lb (64.4 kg)  Height: 5\' 3"  (1.6 m)   Physical Exam Constitutional:      General: She is not in acute distress.    Appearance: She is not ill-appearing.  HENT:     Head: Normocephalic and atraumatic.     Mouth/Throat:     Mouth: Mucous membranes are moist.     Pharynx: Oropharynx is clear. No oropharyngeal exudate or posterior oropharyngeal erythema.  Eyes:     General: No scleral icterus.    Conjunctiva/sclera: Conjunctivae normal.  Cardiovascular:     Rate and Rhythm: Normal rate and regular rhythm.     Pulses: Normal pulses.     Heart sounds: Normal heart sounds. No murmur heard. Pulmonary:     Effort: Pulmonary effort is normal.     Breath sounds: Normal breath sounds. No wheezing, rhonchi or rales.  Musculoskeletal:     Right lower leg: No edema.     Left lower leg: No edema.  Skin:    General: Skin is warm and dry.  Neurological:     General: No focal deficit present.     Mental Status: She is alert.  Psychiatric:        Mood and Affect:  Mood normal.        Behavior: Behavior normal.        Thought Content: Thought content normal.        Judgment: Judgment normal.   CBC No results found for: WBC, RBC, HGB, HCT, PLT, MCV, MCH, MCHC, RDW, LYMPHSABS, MONOABS, EOSABS, BASOSABS  Chest imaging: HRCT Chest 10/2017 1. Mild cylindrical bronchiectasis throughout both lungs, most prominent in the right middle lobe and upper lobes, with associated diffuse bronchial wall thickening, mild patchy tree-in-bud opacities and small subpleural foci of postinfectious scarring. These findings are most compatible with a chronic infectious bronchiolitis such as due to atypical mycobacterial infection (MAI). 2. Small triangular patchy focus of subpleural consolidation in the dependent basilar right lower lobe, probably infectious, potentially evolving postinfectious scarring. Consider attention on a follow-up chest CT in 6 months. 3. Prominent air trapping throughout both lungs, indicative of small airways disease. 4. Mild nonspecific mediastinal lymphadenopathy, probably reactive.  PFT:     View : No data to display.          Labs: 03/2018 Serum IgG, IgG subclass 1 and 2 both low ANCA negative in 2016  Path:  Echo:  Heart Catheterization:  Other Imaging CT sinuses 2014 showed chronic sinusitis     Assessment & Plan:   Bronchiectasis without complication ([place]) - Plan: Respiratory or Resp and Sputum Culture, AFB Culture & Smear, Fungus Culture & Smear  Immunoglobulin deficiency (HCC)  Discussion: Bridget Long is a 66 year old woman, never smoker with history of immunoglobulin deficiency and chronic sinusitis who returns to pulmonary clinic for bronchiectasis  Her bronchiectasis is secondary to immunoglobulin deficiency and she has been on IVIG infusions since 07/2020.  Her bronchiectasis appears to be under good control at this time with recent exacerbation due to respiratory viral illness which seems to be on the tail  end  as her symptoms are improving without antibiotic treatment or her recommended sinus rinses or pulmonary hygiene routine.  She has a nebulizer machine and solution at home as well as a flutter valve and is well versed in picking up bronchial hygiene if she notices changes in her sputum production. I have advised her against using ICS inhalers due to increased risk of infection I have sent in stiolto prescription for her to try.  Her recent CT findings with increased RUL tree-in-bud nodularity is likely related to her recent viral infection. We will have her return in 3 months to determine if further chest imaging is needed based on her clinical symptoms at that time.  She has significant sinus congestion and postnasal drainage which she manages with as needed Flonase.  I recommended she do daily sinus rinses per ENT recommendations.   Follow up in 3 months.  Freda Jackson, MD [facility] Office: [phone]   Current Outpatient Medications:    acetaminophen (TYLENOL) 500 MG tablet, TAKE ONE TABLET BY MOUTH EVERY 28 DAYS FOR IVIG PRE-TREATMENT ONLY, Disp: , Rfl:    albuterol (PROVENTIL) (2.5 MG/3ML) 0.083% nebulizer solution, Take 3 mLs (2.5 mg total) by nebulization 2 (two) times daily., Disp: 120 mL, Rfl: 11   benzonatate (TESSALON) 100 MG capsule, TAKE ONE CAPSULE BY MOUTH EVERY FOUR HOURS FOR COUGH, Disp: , Rfl:    cholecalciferol (VITAMIN D) 1000 units tablet, Take 1,000 Units by mouth daily., Disp: , Rfl:    diphenhydrAMINE (BENADRYL) 50 MG/ML injection, INJECT 25 MG (0.5 ML) INTRAVENOUSLY EVERY 28 DAYS FOR IVIG PRE-TREATMENT ONLY, Disp: , Rfl:    DULoxetine (CYMBALTA) 60 MG capsule, Take 60 mg by mouth daily., Disp: , Rfl:    fexofenadine (ALLEGRA) 180 MG tablet, Take 180 mg by mouth daily., Disp: , Rfl:    fluticasone-salmeterol (ADVAIR HFA) 115-21 MCG/ACT inhaler, Inhale 2 puffs into the lungs 2 (two) times daily., Disp: , Rfl:    Immune Globulin, Human, 20  GM/200ML SOLN, Inject 1 g/kg into the vein. Every 28 days, Disp: , Rfl:    ipratropium (ATROVENT) 0.03 % nasal spray, 2 sprays 2 (two) times daily., Disp: , Rfl:    ketotifen (ZADITOR) 0.025 % ophthalmic solution, 2 drops 2 (two) times daily., Disp: , Rfl:    lamoTRIgine (LAMICTAL) 200 MG tablet, Take 200 mg by mouth 2 (two) times daily., Disp: , Rfl:    lurasidone (LATUDA) 40 MG TABS tablet, Take 40 mg by mouth daily., Disp: , Rfl:    Misc Natural Products (IMMUNE FORMULA PO), Take 10 % by mouth. Every 28 days, Disp: , Rfl:    montelukast (SINGULAIR) 10 MG tablet, Take 1 tablet by mouth daily., Disp: , Rfl:    sodium chloride HYPERTONIC 3 % nebulizer solution, Take by nebulization 2 (two) times daily., Disp: 300 mL, Rfl: 11

## 2022-01-29 ENCOUNTER — Encounter: Payer: Self-pay | Admitting: Pulmonary Disease

## 2022-02-03 ENCOUNTER — Other Ambulatory Visit: Payer: Self-pay

## 2022-02-03 MED ORDER — AMOXICILLIN-POT CLAVULANATE 875-125 MG PO TABS: 1.0000 | ORAL_TABLET | Freq: Two times a day (BID) | ORAL | 0 refills | Status: DC

## 2022-02-13 ENCOUNTER — Telehealth: Payer: Self-pay | Admitting: Pulmonary Disease

## 2022-02-13 NOTE — Telephone Encounter (Signed)
Called and spoke with patient. She stated that she has been on the Augmentin for about a week and has noticed a red, bumpy rash on her forearms. She stopped the abx on Wednesday and the rash is almost gone. She does not want to continue the abx. She denied any anaphylaxis symptoms.   She feels like the Stiolto has helped with her cough and she is now able to get the phlegm out.   I advised her that Dr. Francine Graven was out of the office today, she is ok with waiting until next week.   Dr. Francine Graven, can you please advise? Thanks!

## 2022-02-16 NOTE — Telephone Encounter (Signed)
Spoke with pt who states that she first believed that itching and hives ( 4- 5 bumps on forearms) developed after taking Augmentin. Pt stopped taking Augmentin on Wednesday and as of today pt is still itching. Pt now thinks the hives are insect bites. Pt states she has 14 day supply of Augmentin which was given to her by PCP and would like resume Augmentin if Dr. Francine Graven advises. Dr. Francine Graven please advise.

## 2022-02-16 NOTE — Telephone Encounter (Signed)
Attempted to call pt but unable to reach. Left message for her to return call. 

## 2022-02-16 NOTE — Telephone Encounter (Signed)
Patient is returning phone call. Patient phone number is [phone].

## 2022-02-16 NOTE — Telephone Encounter (Signed)
Patient is aware of below message/recommendations and voiced her understanding. She stated that she does not need a Rx for stiolto. Nothing further needed.

## 2022-02-16 NOTE — Telephone Encounter (Signed)
Ok for patient to complete her augmentin course as prescribed.  If she needs script to continue on stiolto, please send in as well.  Thanks, JD

## 2022-02-26 LAB — RESPIRATORY CULTURE OR RESPIRATORY AND SPUTUM CULTURE
MICRO NUMBER:: 13464592
SPECIMEN QUALITY:: ADEQUATE

## 2022-02-26 LAB — FUNGUS CULTURE W SMEAR
CULTURE:: NO GROWTH
MICRO NUMBER:: 13464591
SMEAR:: NONE SEEN
SPECIMEN QUALITY:: ADEQUATE

## 2022-03-20 LAB — AFB CULTURE WITH SMEAR (NOT AT ARMC)
Acid Fast Culture: NEGATIVE
Acid Fast Smear: NEGATIVE

## 2022-04-28 ENCOUNTER — Telehealth: Payer: Self-pay | Admitting: Pulmonary Disease

## 2022-04-29 ENCOUNTER — Ambulatory Visit: Payer: No Typology Code available for payment source | Admitting: Pulmonary Disease

## 2022-05-01 NOTE — Telephone Encounter (Signed)
I think this is a triage issue- [facility] denies [place] when patient can get care there. Based on message, patient is wanting to discuss testings? Please call patient.

## 2022-05-01 NOTE — Telephone Encounter (Signed)
I called the patient and she is going to be having some testing for Cystic Fibrosis. She has not been told that we have an approval  for any future office visits at this time. Nothing further needed.

## 2022-06-16 ENCOUNTER — Encounter: Payer: Self-pay | Admitting: Pulmonary Disease

## 2022-06-17 ENCOUNTER — Telehealth: Payer: Self-pay | Admitting: Pulmonary Disease

## 2022-06-17 NOTE — Telephone Encounter (Signed)
Left voicemail for patient to call back to schedule follow up as her [place] authorization expires 10/27/2022 per the [facility].

## 2022-07-01 NOTE — Telephone Encounter (Signed)
Per patient's chart she is scheduled for 11/16 at 315pm. Will close encounter.

## 2022-07-16 ENCOUNTER — Ambulatory Visit (INDEPENDENT_AMBULATORY_CARE_PROVIDER_SITE_OTHER): Payer: No Typology Code available for payment source | Admitting: Pulmonary Disease

## 2022-07-16 ENCOUNTER — Encounter: Payer: Self-pay | Admitting: Pulmonary Disease

## 2022-07-16 DIAGNOSIS — J479 Bronchiectasis, uncomplicated: Secondary | ICD-10-CM

## 2022-07-16 NOTE — Progress Notes (Signed)
Synopsis: Referred in September 2022 for cough/shortness of breath by Marcelyn Bruins, MD. Former patient of Dr. Kendrick Fries.   Subjective:   PATIENT ID: Bridget Long GENDER: female DOB: [birth year], MRN: [id number]  HPI  Chief Complaint  Patient presents with   Follow-up    F/U after CF diagnosis.    Bridget Long is a 66 year old woman, never smoker with history of immunoglobulin deficiency and chronic sinusitis who returns to pulmonary clinic for bronchiectasis  She was recently diagnosed with cystic fibrosis at [facility] by Dr. Shirlee Limerick. 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 She was started on Elexacaftor/Tezacaftor/Ivacaftor.  She reports feeling much better since starting treatment.   OV 01/28/22 She was seen by ENT 07/2021 and was recommended she use saline sinus rinses daily which she is using as needed.   She reports recent respiratory infection with increased cough, mucous production from her chest and sinuses. She reports significant cough with post-tussive emesis episodes. She was never treated with antibiotics as it was felt to be viral infection from her family who she visited in [place] state. She is using flutter valve occasionally in the mornings. She is not using nebulizer treatments. She reports her cough and mucous are improving.  She had CT scan done yesterday at the [place] which shows increased RUL tree in bud involvement and mucous impaction. She was provided a prescription for advair by her immunologist at the [place]. She has not started this inhaler.   OV 05/27/21 She has been followed by Dr. Shelle Iron for her bronchiectasis at the [facility] clinic but he is retiring and she has been referred back to our clinic for further pulmonary care.  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  She recently started IVIG infusions since December  2021.  She continues to have sinus congestion and postnasal drainage.  She reports an intermittent cough with sputum production that is likely secondary to her postnasal drainage.  She will use a flutter valve regularly to help cough up mucus.  She is not using albuterol nebulizer treatments.  She denies issues with wheezing.  She will use Flonase if she notices an increase in congestion/drainage.  She has not had a sense of smell since 2013.  She previously lived in [place] prior to moving to [place] and had a sense of smell prior to her move.  She has follow-up with ENT in the near future. She does have history of sinus polyps s/p sinus surgery and removal. ANCA testing is negative from 2016.   She is a never smoker.  She was exposed to secondhand smoke as a child.  She retired from [employer] in 2017.  She was in [employer] and part of the [employer].  Notes reviewed from Dr. Shelle Iron, she was last seen 04/01/21. He reports a CT chest with increased density in the RUL along with increased tree-in-bud pattern of the right lung. She has grown MSSA in her sputum in 2021 with AFB negative at that time. Eosinophil count was 340 12/2019.   Past Medical History:  Diagnosis Date   Bipolar 1 disorder (HCC)    Depression    Diverticulitis      Family History  Problem Relation Age of Onset   Pulmonary embolism Father    Alzheimer's disease Father    Healthy Sister    Allergic rhinitis Neg Hx  Angioedema Neg Hx    Asthma Neg Hx    Eczema Neg Hx    Immunodeficiency Neg Hx    Urticaria Neg Hx      Social History   Socioeconomic History   Marital status: Married    Spouse name: Not on file   Number of children: 2   Years of education: BS   Highest education level: Not on file  Occupational History   Occupation: [employer]  Tobacco Use   Smoking status: Never   Smokeless tobacco: Never  Vaping Use   Vaping Use: Never used  Substance and Sexual Activity   Alcohol use: Yes     Alcohol/week: 0.0 standard drinks of alcohol    Comment: 2 glasses wine 3 times a week   Drug use: No   Sexual activity: Not on file  Other Topics Concern   Not on file  Social History Narrative   Patient is right handed   Patient drinks 3 cups caffeine daily   Social Determinants of Health   Financial Resource Strain: Not on file  Food Insecurity: Not on file  Transportation Needs: Not on file  Physical Activity: Not on file  Stress: Not on file  Social Connections: Not on file  Intimate Partner Violence: Not on file     Allergies  Allergen Reactions   Sulfa Antibiotics Rash     Outpatient Medications Prior to Visit  Medication Sig Dispense Refill   acetaminophen (TYLENOL) 500 MG tablet TAKE ONE TABLET BY MOUTH EVERY 28 DAYS FOR IVIG PRE-TREATMENT ONLY     albuterol (PROVENTIL) (2.5 MG/3ML) 0.083% nebulizer solution Take 3 mLs (2.5 mg total) by nebulization 2 (two) times daily. 120 mL 11   benzonatate (TESSALON) 100 MG capsule TAKE ONE CAPSULE BY MOUTH EVERY FOUR HOURS FOR COUGH     cholecalciferol (VITAMIN D) 1000 units tablet Take 1,000 Units by mouth daily.     diphenhydrAMINE (BENADRYL) 50 MG/ML injection INJECT 25 MG (0.5 ML) INTRAVENOUSLY EVERY 28 DAYS FOR IVIG PRE-TREATMENT ONLY     DULoxetine (CYMBALTA) 60 MG capsule Take 60 mg by mouth daily.     Elexacaf-Tezacaf-Ivacaf&Ivacaf 100-50-75 & 150 MG TBPK 2 yellow tabs in the AM and 1 blue tab in the PM. Take with fatty food. E84.9     fexofenadine (ALLEGRA) 180 MG tablet Take 180 mg by mouth daily.     Immune Globulin, Human, 20 GM/200ML SOLN Inject 1 g/kg into the vein. Every 28 days     ipratropium (ATROVENT) 0.03 % nasal spray 2 sprays 2 (two) times daily.     ketotifen (ZADITOR) 0.025 % ophthalmic solution 2 drops 2 (two) times daily.     lamoTRIgine (LAMICTAL) 200 MG tablet Take 200 mg by mouth 2 (two) times daily.     lurasidone (LATUDA) 40 MG TABS tablet Take 40 mg by mouth daily.     Misc Natural Products  (IMMUNE FORMULA PO) Take 10 % by mouth. Every 28 days     montelukast (SINGULAIR) 10 MG tablet Take 1 tablet by mouth daily.     sodium chloride HYPERTONIC 3 % nebulizer solution Take by nebulization 2 (two) times daily. 300 mL 11   Tiotropium Bromide-Olodaterol (STIOLTO RESPIMAT) 2.5-2.5 MCG/ACT AERS Inhale 2 puffs into the lungs daily. (Patient taking differently: Inhale 2 puffs into the lungs daily as needed (cough, wheezing, dyspnea).) 4 g 5   amoxicillin-clavulanate (AUGMENTIN) 875-125 MG tablet Take 1 tablet by mouth 2 (two) times daily. 28 tablet 0  No facility-administered medications prior to visit.    Review of Systems  Constitutional:  Negative for chills, fever, malaise/fatigue and weight loss.  HENT:  Negative for congestion, sinus pain and sore throat.   Eyes: Negative.   Respiratory:  Negative for cough, hemoptysis, sputum production, shortness of breath and wheezing.   Cardiovascular:  Negative for chest pain, palpitations, orthopnea, claudication and leg swelling.  Gastrointestinal:  Negative for abdominal pain, heartburn, nausea and vomiting.  Genitourinary: Negative.   Musculoskeletal:  Negative for joint pain and myalgias.  Skin:  Negative for rash.  Neurological:  Negative for weakness.  Endo/Heme/Allergies: Negative.   Psychiatric/Behavioral: Negative.      Objective:   Vitals:   07/16/22 1527  BP: 124/72  Pulse: 74  SpO2: 98%  Weight: 142 lb (64.4 kg)  Height: 5\' 3"  (1.6 m)   Physical Exam Constitutional:      General: She is not in acute distress.    Appearance: She is not ill-appearing.  HENT:     Head: Normocephalic and atraumatic.     Mouth/Throat:     Mouth: Mucous membranes are moist.     Pharynx: Oropharynx is clear. No oropharyngeal exudate or posterior oropharyngeal erythema.  Eyes:     General: No scleral icterus.    Conjunctiva/sclera: Conjunctivae normal.  Cardiovascular:     Rate and Rhythm: Normal rate and regular rhythm.      Pulses: Normal pulses.     Heart sounds: Normal heart sounds. No murmur heard. Pulmonary:     Effort: Pulmonary effort is normal.     Breath sounds: Normal breath sounds. No wheezing, rhonchi or rales.  Musculoskeletal:     Right lower leg: No edema.     Left lower leg: No edema.  Skin:    General: Skin is warm and dry.  Neurological:     General: No focal deficit present.     Mental Status: She is alert.  Psychiatric:        Mood and Affect: Mood normal.        Behavior: Behavior normal.        Thought Content: Thought content normal.        Judgment: Judgment normal.    CBC No results found for: "WBC", "RBC", "HGB", "HCT", "PLT", "MCV", "MCH", "MCHC", "RDW", "LYMPHSABS", "MONOABS", "EOSABS", "BASOSABS"  Chest imaging: HRCT Chest 10/2017 1. Mild cylindrical bronchiectasis throughout both lungs, most prominent in the right middle lobe and upper lobes, with associated diffuse bronchial wall thickening, mild patchy tree-in-bud opacities and small subpleural foci of postinfectious scarring. These findings are most compatible with a chronic infectious bronchiolitis such as due to atypical mycobacterial infection (MAI). 2. Small triangular patchy focus of subpleural consolidation in the dependent basilar right lower lobe, probably infectious, potentially evolving postinfectious scarring. Consider attention on a follow-up chest CT in 6 months. 3. Prominent air trapping throughout both lungs, indicative of small airways disease. 4. Mild nonspecific mediastinal lymphadenopathy, probably reactive.  PFT:     No data to display          Labs: 03/2018 Serum IgG, IgG subclass 1 and 2 both low ANCA negative in 2016  Path:  Echo:  Heart Catheterization:  Other Imaging CT sinuses 2014 showed chronic sinusitis     Assessment & Plan:   Cystic fibrosis (HCC)  Bronchiectasis without complication (HCC)  Discussion: Bridget MangoMachelle Borror is a 66 year old woman, never smoker with  history of immunoglobulin deficiency and chronic sinusitis who returns to pulmonary  clinic for bronchiectasis.  Her bronchiectasis is secondary to cystic fibrosis based on recent diagnosis by Dr. Shirlee Limerick at [facility]. She was started on Elexacaftor/Tezacaftor/Ivacaftor with good effect. She does not have much sinus or chest congestion at this time.   She is to continue stiolto as needed. She can use hypertonic saline neb treatments and flutter valve as needed for airway clearance.  Follow up in 1 year.   Melody Comas, MD [facility] Office: [phone]   Current Outpatient Medications:    acetaminophen (TYLENOL) 500 MG tablet, TAKE ONE TABLET BY MOUTH EVERY 28 DAYS FOR IVIG PRE-TREATMENT ONLY, Disp: , Rfl:    albuterol (PROVENTIL) (2.5 MG/3ML) 0.083% nebulizer solution, Take 3 mLs (2.5 mg total) by nebulization 2 (two) times daily., Disp: 120 mL, Rfl: 11   benzonatate (TESSALON) 100 MG capsule, TAKE ONE CAPSULE BY MOUTH EVERY FOUR HOURS FOR COUGH, Disp: , Rfl:    cholecalciferol (VITAMIN D) 1000 units tablet, Take 1,000 Units by mouth daily., Disp: , Rfl:    diphenhydrAMINE (BENADRYL) 50 MG/ML injection, INJECT 25 MG (0.5 ML) INTRAVENOUSLY EVERY 28 DAYS FOR IVIG PRE-TREATMENT ONLY, Disp: , Rfl:    DULoxetine (CYMBALTA) 60 MG capsule, Take 60 mg by mouth daily., Disp: , Rfl:    Elexacaf-Tezacaf-Ivacaf&Ivacaf 100-50-75 & 150 MG TBPK, 2 yellow tabs in the AM and 1 blue tab in the PM. Take with fatty food. E84.9, Disp: , Rfl:    fexofenadine (ALLEGRA) 180 MG tablet, Take 180 mg by mouth daily., Disp: , Rfl:    Immune Globulin, Human, 20 GM/200ML SOLN, Inject 1 g/kg into the vein. Every 28 days, Disp: , Rfl:    ipratropium (ATROVENT) 0.03 % nasal spray, 2 sprays 2 (two) times daily., Disp: , Rfl:    ketotifen (ZADITOR) 0.025 % ophthalmic solution, 2 drops 2 (two) times daily., Disp: , Rfl:    lamoTRIgine (LAMICTAL) 200 MG tablet, Take 200 mg by mouth 2 (two) times  daily., Disp: , Rfl:    lurasidone (LATUDA) 40 MG TABS tablet, Take 40 mg by mouth daily., Disp: , Rfl:    Misc Natural Products (IMMUNE FORMULA PO), Take 10 % by mouth. Every 28 days, Disp: , Rfl:    montelukast (SINGULAIR) 10 MG tablet, Take 1 tablet by mouth daily., Disp: , Rfl:    sodium chloride HYPERTONIC 3 % nebulizer solution, Take by nebulization 2 (two) times daily., Disp: 300 mL, Rfl: 11   Tiotropium Bromide-Olodaterol (STIOLTO RESPIMAT) 2.5-2.5 MCG/ACT AERS, Inhale 2 puffs into the lungs daily. (Patient taking differently: Inhale 2 puffs into the lungs daily as needed (cough, wheezing, dyspnea).), Disp: 4 g, Rfl: 5

## 2022-07-16 NOTE — Patient Instructions (Addendum)
Use stiolto as needed  Use hypertonic nebulizer treatments as needed  Continue Elexacaftor/Tezacaftor/Ivacaftor per Dr. Shirlee Limerick  Follow up in 1 year

## 2022-07-24 ENCOUNTER — Encounter: Payer: Self-pay | Admitting: Pulmonary Disease

## 2023-06-25 ENCOUNTER — Telehealth: Payer: Self-pay | Admitting: Pulmonary Disease

## 2023-06-25 NOTE — Telephone Encounter (Signed)
FYI, patient is receiving care at [facility] for cyctic fibrosis with pulmonary manifestations.

## 2023-12-14 ENCOUNTER — Encounter (HOSPITAL_BASED_OUTPATIENT_CLINIC_OR_DEPARTMENT_OTHER): Payer: Self-pay

## 2023-12-14 ENCOUNTER — Other Ambulatory Visit: Payer: Self-pay

## 2023-12-14 DIAGNOSIS — M79642 Pain in left hand: Secondary | ICD-10-CM | POA: Diagnosis not present

## 2023-12-14 DIAGNOSIS — M25512 Pain in left shoulder: Secondary | ICD-10-CM | POA: Diagnosis not present

## 2023-12-14 DIAGNOSIS — S79921A Unspecified injury of right thigh, initial encounter: Secondary | ICD-10-CM | POA: Diagnosis present

## 2023-12-14 DIAGNOSIS — S7011XA Contusion of right thigh, initial encounter: Secondary | ICD-10-CM | POA: Insufficient documentation

## 2023-12-14 DIAGNOSIS — Z23 Encounter for immunization: Secondary | ICD-10-CM | POA: Diagnosis not present

## 2023-12-14 DIAGNOSIS — J45909 Unspecified asthma, uncomplicated: Secondary | ICD-10-CM | POA: Insufficient documentation

## 2023-12-14 DIAGNOSIS — W108XXA Fall (on) (from) other stairs and steps, initial encounter: Secondary | ICD-10-CM | POA: Insufficient documentation

## 2023-12-14 DIAGNOSIS — M542 Cervicalgia: Secondary | ICD-10-CM | POA: Diagnosis not present

## 2023-12-14 DIAGNOSIS — Y92520 Airport as the place of occurrence of the external cause: Secondary | ICD-10-CM | POA: Diagnosis not present

## 2023-12-14 DIAGNOSIS — R519 Headache, unspecified: Secondary | ICD-10-CM | POA: Insufficient documentation

## 2023-12-14 NOTE — ED Triage Notes (Addendum)
 Pt was coming back in from a trip and fell down escalator, hit the back of her head on concrete has a orange sized knot on back of head.  Per pt has abrasions rt. Elbow, left hand and face. Pt is c/o left hand pain , left shoulder, head and neck   Denies LOC No blood thinners Accident happened at 1930 Pt has taken motrin and tylenol

## 2023-12-15 ENCOUNTER — Emergency Department (HOSPITAL_BASED_OUTPATIENT_CLINIC_OR_DEPARTMENT_OTHER)
Admission: EM | Admit: 2023-12-15 | Discharge: 2023-12-15 | Disposition: A | Discharge status: Home / Self Care | Attending: Emergency Medicine | Admitting: Emergency Medicine

## 2023-12-15 ENCOUNTER — Emergency Department (HOSPITAL_BASED_OUTPATIENT_CLINIC_OR_DEPARTMENT_OTHER)

## 2023-12-15 ENCOUNTER — Emergency Department (HOSPITAL_BASED_OUTPATIENT_CLINIC_OR_DEPARTMENT_OTHER): Admitting: Radiology

## 2023-12-15 DIAGNOSIS — S42032A Displaced fracture of lateral end of left clavicle, initial encounter for closed fracture: Secondary | ICD-10-CM

## 2023-12-15 DIAGNOSIS — S12101A Unspecified nondisplaced fracture of second cervical vertebra, initial encounter for closed fracture: Secondary | ICD-10-CM

## 2023-12-15 DIAGNOSIS — W19XXXA Unspecified fall, initial encounter: Secondary | ICD-10-CM

## 2023-12-15 DIAGNOSIS — S0990XA Unspecified injury of head, initial encounter: Secondary | ICD-10-CM

## 2023-12-15 MED ORDER — HYDROCODONE-ACETAMINOPHEN 5-325 MG PO TABS: 1.0000 | ORAL_TABLET | Freq: Four times a day (QID) | ORAL | 0 refills | Status: AC | PRN

## 2023-12-15 MED ORDER — HYDROCODONE-ACETAMINOPHEN 5-325 MG PO TABS
2.0000 | ORAL_TABLET | Freq: Once | ORAL | Status: AC
  Administered 2023-12-15: 2 via ORAL
  Filled 2023-12-15: qty 2

## 2023-12-15 MED ORDER — TETANUS-DIPHTH-ACELL PERTUSSIS 5-2.5-18.5 LF-MCG/0.5 IM SUSY
PREFILLED_SYRINGE | INTRAMUSCULAR | Status: AC
  Filled 2023-12-15: qty 0.5

## 2023-12-15 MED ORDER — TETANUS-DIPHTH-ACELL PERTUSSIS 5-2.5-18.5 LF-MCG/0.5 IM SUSY
0.5000 mL | PREFILLED_SYRINGE | Freq: Once | INTRAMUSCULAR | Status: AC
  Administered 2023-12-15: 0.5 mL via INTRAMUSCULAR

## 2023-12-15 NOTE — ED Notes (Signed)
 C-collar in place, and maintained. Pt alert oriented. Denies paresthesias. Pain controlled at present.

## 2023-12-15 NOTE — ED Provider Notes (Signed)
 [place] EMERGENCY DEPARTMENT AT [facility] Provider Note   CSN: [id number] Arrival date & time: 12/14/23  2338     History  Chief Complaint  Patient presents with   Marletta Lor    Bridget Long is a 68 y.o. female.  Patient is a 68 year old female with past medical history of asthma.  Patient presenting today for evaluation of injuries sustained in a fall.  She was running down an escalator in the [place] airport trying to catch a flight when she fell forward and tumbled down multiple stairs.  She has pain to the left shoulder, neck, left hand, and head.  She denies any loss of consciousness.  She was able to fly home, then came here for evaluation.       Home Medications Prior to Admission medications   Medication Sig Start Date End Date Taking? Authorizing Provider  acetaminophen (TYLENOL) 500 MG tablet TAKE ONE TABLET BY MOUTH EVERY 28 DAYS FOR IVIG PRE-TREATMENT ONLY 04/25/21   [provider]  albuterol (PROVENTIL) (2.5 MG/3ML) 0.083% nebulizer solution Take 3 mLs (2.5 mg total) by nebulization 2 (two) times daily. 10/26/17   Lupita Leash, MD  benzonatate (TESSALON) 100 MG capsule TAKE ONE CAPSULE BY MOUTH EVERY FOUR HOURS FOR COUGH 01/13/22   [provider]  cholecalciferol (VITAMIN D) 1000 units tablet Take 1,000 Units by mouth daily.    [provider]  diphenhydrAMINE (BENADRYL) 50 MG/ML injection INJECT 25 MG (0.5 ML) INTRAVENOUSLY EVERY 28 DAYS FOR IVIG PRE-TREATMENT ONLY 04/25/21   [provider]  DULoxetine (CYMBALTA) 60 MG capsule Take 60 mg by mouth daily. 04/30/21   [provider]  Elexacaf-Tezacaf-Ivacaf&Ivacaf 100-50-75 & 150 MG TBPK 2 yellow tabs in the AM and 1 blue tab in the PM. Take with fatty food. E84.9 05/26/22   [provider]  fexofenadine (ALLEGRA) 180 MG tablet Take 180 mg by mouth daily.    [provider]  Immune Globulin, Human, 20 GM/200ML SOLN Inject 1 g/kg into the vein.  Every 28 days    [provider]  ipratropium (ATROVENT) 0.03 % nasal spray 2 sprays 2 (two) times daily. 01/27/22   [provider]  ketotifen (ZADITOR) 0.025 % ophthalmic solution 2 drops 2 (two) times daily.    [provider]  lamoTRIgine (LAMICTAL) 200 MG tablet Take 200 mg by mouth 2 (two) times daily. 02/13/21   [provider]  lurasidone (LATUDA) 40 MG TABS tablet Take 40 mg by mouth daily. 03/20/21   [provider]  Misc Natural Products (IMMUNE FORMULA PO) Take 10 % by mouth. Every 28 days    [provider]  montelukast (SINGULAIR) 10 MG tablet Take 1 tablet by mouth daily. 01/27/22   [provider]  sodium chloride HYPERTONIC 3 % nebulizer solution Take by nebulization 2 (two) times daily. 10/26/17   Lupita Leash, MD  Tiotropium Bromide-Olodaterol (STIOLTO RESPIMAT) 2.5-2.5 MCG/ACT AERS Inhale 2 puffs into the lungs daily. Patient taking differently: Inhale 2 puffs into the lungs daily as needed (cough, wheezing, dyspnea). 01/28/22   Martina Sinner, MD      Allergies    Sulfa antibiotics    Review of Systems   Review of Systems  All other systems reviewed and are negative.   Physical Exam Updated Vital Signs BP (!) 142/75   Pulse 70   Temp 97.7 F (36.5 C) (Oral)   Resp 17   Ht 5\' 2"  (1.575 m)   Wt 68.5 kg  SpO2 100%   BMI 27.62 kg/m  Physical Exam Vitals and nursing note reviewed.  Constitutional:      General: She is not in acute distress.    Appearance: She is well-developed. She is not diaphoretic.  HENT:     Head: Normocephalic.     Comments: There is slight bruising noted lateral to the right thigh in the area of the zygoma. Eyes:     Extraocular Movements: Extraocular movements intact.     Pupils: Pupils are equal, round, and reactive to light.     Comments: Patient has full range of motion of extraocular muscles.  There is no proptosis.  Cardiovascular:     Rate and Rhythm: Normal  rate and regular rhythm.     Heart sounds: No murmur heard.    No friction rub. No gallop.  Pulmonary:     Effort: Pulmonary effort is normal. No respiratory distress.     Breath sounds: Normal breath sounds. No wheezing.  Abdominal:     General: Bowel sounds are normal. There is no distension.     Palpations: Abdomen is soft.     Tenderness: There is no abdominal tenderness.  Musculoskeletal:        General: Normal range of motion.     Cervical back: Normal range of motion and neck supple.     Comments: The left shoulder has tenderness over the top of the shoulder.  Ulnar and radial pulses are palpable and motor and sensation are intact throughout the entire hand.  There is some swelling and superficial abrasions noted to the dorsum of the left hand, but no deformity.  She has good range of motion of all fingers.  Skin:    General: Skin is warm and dry.  Neurological:     General: No focal deficit present.     Mental Status: She is alert and oriented to person, place, and time.     Cranial Nerves: No cranial nerve deficit.     Motor: No weakness.     ED Results / Procedures / Treatments   Labs (all labs ordered are listed, but only abnormal results are displayed) Labs Reviewed - No data to display  EKG None  Radiology DG Shoulder Left Result Date: 12/15/2023 CLINICAL DATA:  Fall, left shoulder pain EXAM: LEFT SHOULDER - 2+ VIEW COMPARISON:  None Available. FINDINGS: There is an acute, minimally displaced, intra-articular fracture of the distal left clavicle with 2 cortical with dorsal displacement in near anatomic alignment the distal fracture fragment. Acromioclavicular joint space appears preserved. No other fracture identified. No dislocation. Limited evaluation of the left hemithorax is unremarkable. IMPRESSION: 1. Minimally displaced, intra-articular fracture of the distal left clavicle. Electronically Signed   By: Worthy Heads M.D.   On: 12/15/2023 00:38   DG Hand  Complete Left Result Date: 12/15/2023 CLINICAL DATA:  Fall, left hand injury EXAM: LEFT HAND - COMPLETE 3+ VIEW COMPARISON:  None Available. FINDINGS: Imaging is slightly limited by the ring at the base of the fourth digit. Normal alignment. No definite fracture or dislocation. Multifocal degenerative changes are seen, most severe at the base of the thumb at the first carpometacarpal joint. Soft tissues are unremarkable. IMPRESSION: 1. No acute fracture or dislocation. 2. Multifocal degenerative joint disease.  Root Electronically Signed   By: Worthy Heads M.D.   On: 12/15/2023 00:35   CT Cervical Spine Wo Contrast Result Date: 12/15/2023 CLINICAL DATA:  Neck trauma (Age >= 65y).  Fall.  Hit back of  head. EXAM: CT CERVICAL SPINE WITHOUT CONTRAST TECHNIQUE: Multidetector CT imaging of the cervical spine was performed without intravenous contrast. Multiplanar CT image reconstructions were also generated. RADIATION DOSE REDUCTION: This exam was performed according to the departmental dose-optimization program which includes automated exposure control, adjustment of the mA and/or kV according to patient size and/or use of iterative reconstruction technique. COMPARISON:  None Available. FINDINGS: Alignment: Normal Skull base and vertebrae: There is a fracture in the anterior aspect of the right lateral mass at C2. No vertebral body or or odontoid involvement. No additional fracture. Soft tissues and spinal canal: No prevertebral fluid or swelling. No visible canal hematoma. Disc levels: Degenerative facet disease, left greater than right. Mild degenerative disc disease in the lower cervical spine. Upper chest: No acute findings Other: None IMPRESSION: Fracture through the anterior aspect of the C2 right lateral mass. No vertebral body or odontoid involvement. Degenerative disc and facet disease. Electronically Signed   By: Janeece Mechanic M.D.   On: 12/15/2023 00:26   CT Head Wo Contrast Result Date:  12/15/2023 CLINICAL DATA:  Head trauma, minor (Age >= 65y) fall.  Hit back of EXAM: CT HEAD WITHOUT CONTRAST TECHNIQUE: Contiguous axial images were obtained from the base of the skull through the vertex without intravenous contrast. RADIATION DOSE REDUCTION: This exam was performed according to the departmental dose-optimization program which includes automated exposure control, adjustment of the mA and/or kV according to patient size and/or use of iterative reconstruction technique. COMPARISON:  None Available. FINDINGS: Brain: No acute intracranial abnormality. Specifically, no hemorrhage, hydrocephalus, mass lesion, acute infarction, or significant intracranial injury. Vascular: No hyperdense vessel or unexpected calcification. Skull: No acute calvarial abnormality. Sinuses/Orbits: No acute findings Other: None IMPRESSION: No acute intracranial abnormality. Electronically Signed   By: Janeece Mechanic M.D.   On: 12/15/2023 00:18    Procedures Procedures    Medications Ordered in ED Medications  Tdap (BOOSTRIX) 5-2.5-18.5 LF-MCG/0.5 injection ( Intramuscular Not Given 12/15/23 0139)  Tdap (BOOSTRIX) injection 0.5 mL (0.5 mLs Intramuscular Given 12/15/23 0124)    ED Course/ Medical Decision Making/ A&P  Patient presenting with complaints of injuries sustained in a fall, the details of which are described in the HPI.  Patient arrives here with stable vital signs and is afebrile.  Imaging studies obtained including CT scan of the head and cervical spine.  Head CT is negative, but cervical spine does show a fracture of C2.  X-rays of the left hand are negative, but x-rays of the left shoulder show a distal clavicle fracture.  The cervical spine fracture was discussed with Dr. Ellery Guthrie from neurosurgery.  He does not feel as though this requires any acute intervention.  He is recommending a collar and follow-up in the office.  Patient to be discharged with an arm sling, pain medication, and follow-up with  both neurosurgery and orthopedic surgery.  Final Clinical Impression(s) / ED Diagnoses Final diagnoses:  None    Rx / DC Orders ED Discharge Orders     None         Orvilla Blander, MD 12/15/23 [phone]

## 2023-12-15 NOTE — Discharge Instructions (Addendum)
 Wear arm sling and cervical collar for comfort and support.  Begin taking hydrocodone as prescribed as needed for pain.  Follow-up with orthopedic surgery in the next week.  The contact information for Dr. Vaughn Georges has been provided in this discharge summary for you to call and make these arrangements.  Follow-up with neurosurgery in the next week.  The contact information for Dr. Ellery Guthrie has been provided in this discharge summary for you to call and make these arrangements.
